# Patient Record
Sex: Male | Born: 1935 | Race: White | Hispanic: No | Smoking: Never smoker
Health system: Southern US, Community
[De-identification: ages and names within clinical notes are randomized; demographics above are authoritative.]

## PROBLEM LIST (undated history)

## (undated) DIAGNOSIS — E785 Hyperlipidemia, unspecified: Secondary | ICD-10-CM

## (undated) DIAGNOSIS — I4892 Unspecified atrial flutter: Secondary | ICD-10-CM

## (undated) DIAGNOSIS — M1990 Unspecified osteoarthritis, unspecified site: Secondary | ICD-10-CM

## (undated) DIAGNOSIS — I2510 Atherosclerotic heart disease of native coronary artery without angina pectoris: Secondary | ICD-10-CM

## (undated) DIAGNOSIS — I499 Cardiac arrhythmia, unspecified: Secondary | ICD-10-CM

## (undated) DIAGNOSIS — R079 Chest pain, unspecified: Secondary | ICD-10-CM

## (undated) DIAGNOSIS — E119 Type 2 diabetes mellitus without complications: Secondary | ICD-10-CM

## (undated) DIAGNOSIS — E039 Hypothyroidism, unspecified: Secondary | ICD-10-CM

## (undated) DIAGNOSIS — I10 Essential (primary) hypertension: Secondary | ICD-10-CM

## (undated) HISTORY — PX: ROTATOR CUFF REPAIR: SHX139

## (undated) HISTORY — PX: REPLACEMENT TOTAL KNEE BILATERAL: SUR1225

## (undated) HISTORY — PX: CARDIAC SURGERY: SHX584

---

## 1998-07-07 ENCOUNTER — Encounter: Payer: Self-pay | Admitting: Orthopedic Surgery

## 1998-07-07 ENCOUNTER — Inpatient Hospital Stay (HOSPITAL_COMMUNITY): Admission: RE | Admit: 1998-07-07 | Discharge: 1998-07-10 | Payer: Self-pay | Admitting: Orthopedic Surgery

## 1998-08-11 ENCOUNTER — Encounter: Admission: RE | Admit: 1998-08-11 | Discharge: 1998-10-23 | Payer: Self-pay | Admitting: Orthopedic Surgery

## 1998-09-22 ENCOUNTER — Ambulatory Visit (HOSPITAL_BASED_OUTPATIENT_CLINIC_OR_DEPARTMENT_OTHER): Admission: RE | Admit: 1998-09-22 | Discharge: 1998-09-22 | Payer: Self-pay | Admitting: Orthopedic Surgery

## 1999-02-02 ENCOUNTER — Ambulatory Visit (HOSPITAL_BASED_OUTPATIENT_CLINIC_OR_DEPARTMENT_OTHER): Admission: RE | Admit: 1999-02-02 | Discharge: 1999-02-02 | Payer: Self-pay | Admitting: Urology

## 2000-12-21 ENCOUNTER — Encounter: Admission: RE | Admit: 2000-12-21 | Discharge: 2001-03-21 | Payer: Self-pay | Admitting: *Deleted

## 2004-03-11 ENCOUNTER — Ambulatory Visit (HOSPITAL_COMMUNITY): Admission: RE | Admit: 2004-03-11 | Discharge: 2004-03-11 | Payer: Self-pay | Admitting: Gastroenterology

## 2004-03-11 ENCOUNTER — Encounter (INDEPENDENT_AMBULATORY_CARE_PROVIDER_SITE_OTHER): Payer: Self-pay | Admitting: *Deleted

## 2006-03-31 ENCOUNTER — Encounter: Admission: RE | Admit: 2006-03-31 | Discharge: 2006-03-31 | Payer: Self-pay | Admitting: Family Medicine

## 2007-12-20 ENCOUNTER — Inpatient Hospital Stay (HOSPITAL_COMMUNITY): Admission: AD | Admit: 2007-12-20 | Discharge: 2007-12-22 | Payer: Self-pay | Admitting: Cardiology

## 2007-12-21 ENCOUNTER — Encounter (INDEPENDENT_AMBULATORY_CARE_PROVIDER_SITE_OTHER): Payer: Self-pay | Admitting: Cardiology

## 2008-08-09 HISTORY — PX: ATRIAL FLUTTER ABLATION: SHX5733

## 2008-09-15 ENCOUNTER — Emergency Department (HOSPITAL_COMMUNITY): Admission: EM | Admit: 2008-09-15 | Discharge: 2008-09-16 | Payer: Self-pay | Admitting: Emergency Medicine

## 2008-11-20 ENCOUNTER — Encounter: Payer: Self-pay | Admitting: Internal Medicine

## 2008-11-20 ENCOUNTER — Ambulatory Visit: Payer: Self-pay | Admitting: Internal Medicine

## 2008-11-20 DIAGNOSIS — I4892 Unspecified atrial flutter: Secondary | ICD-10-CM

## 2008-11-20 DIAGNOSIS — I10 Essential (primary) hypertension: Secondary | ICD-10-CM

## 2008-11-20 HISTORY — DX: Unspecified atrial flutter: I48.92

## 2008-11-20 LAB — CONVERTED CEMR LAB
BUN: 16 mg/dL (ref 6–23)
Basophils Absolute: 0.1 10*3/uL (ref 0.0–0.1)
Basophils Relative: 0.8 % (ref 0.0–3.0)
CO2: 31 meq/L (ref 19–32)
Calcium: 9.4 mg/dL (ref 8.4–10.5)
Chloride: 106 meq/L (ref 96–112)
Creatinine, Ser: 0.9 mg/dL (ref 0.4–1.5)
Eosinophils Absolute: 0.1 10*3/uL (ref 0.0–0.7)
Eosinophils Relative: 1.7 % (ref 0.0–5.0)
GFR calc non Af Amer: 87.39 mL/min (ref >60)
Glucose, Bld: 107 mg/dL — ABNORMAL HIGH (ref 70–99)
HCT: 45.1 % (ref 39.0–52.0)
Hemoglobin: 15.4 g/dL (ref 13.0–17.0)
INR: 1.1 — ABNORMAL HIGH (ref 0.8–1.0)
Lymphocytes Relative: 21 % (ref 12.0–46.0)
Lymphs Abs: 1.7 10*3/uL (ref 0.7–4.0)
MCHC: 34.1 g/dL (ref 30.0–36.0)
MCV: 89.4 fL (ref 78.0–100.0)
Monocytes Absolute: 0.6 10*3/uL (ref 0.1–1.0)
Monocytes Relative: 8 % (ref 3.0–12.0)
Neutro Abs: 5.6 10*3/uL (ref 1.4–7.7)
Neutrophils Relative %: 68.5 % (ref 43.0–77.0)
Platelets: 223 10*3/uL (ref 150.0–400.0)
Potassium: 4.4 meq/L (ref 3.5–5.1)
Prothrombin Time: 11.8 s (ref 10.9–13.3)
RBC: 5.05 M/uL (ref 4.22–5.81)
RDW: 12 % (ref 11.5–14.6)
Sodium: 141 meq/L (ref 135–145)
WBC: 8.1 10*3/uL (ref 4.5–10.5)
aPTT: 29.6 s — ABNORMAL HIGH (ref 21.7–28.8)

## 2008-11-26 ENCOUNTER — Telehealth: Payer: Self-pay | Admitting: Internal Medicine

## 2008-11-27 ENCOUNTER — Inpatient Hospital Stay (HOSPITAL_COMMUNITY): Admission: RE | Admit: 2008-11-27 | Discharge: 2008-11-28 | Payer: Self-pay | Admitting: Internal Medicine

## 2008-11-27 ENCOUNTER — Ambulatory Visit: Payer: Self-pay | Admitting: Internal Medicine

## 2008-12-03 DIAGNOSIS — M1990 Unspecified osteoarthritis, unspecified site: Secondary | ICD-10-CM | POA: Insufficient documentation

## 2008-12-03 DIAGNOSIS — E118 Type 2 diabetes mellitus with unspecified complications: Secondary | ICD-10-CM

## 2008-12-03 DIAGNOSIS — N401 Enlarged prostate with lower urinary tract symptoms: Secondary | ICD-10-CM

## 2008-12-03 DIAGNOSIS — J309 Allergic rhinitis, unspecified: Secondary | ICD-10-CM | POA: Insufficient documentation

## 2008-12-03 DIAGNOSIS — E785 Hyperlipidemia, unspecified: Secondary | ICD-10-CM

## 2009-01-09 ENCOUNTER — Ambulatory Visit: Payer: Self-pay | Admitting: Internal Medicine

## 2010-11-18 LAB — GLUCOSE, CAPILLARY
Glucose-Capillary: 131 mg/dL — ABNORMAL HIGH (ref 70–99)
Glucose-Capillary: 140 mg/dL — ABNORMAL HIGH (ref 70–99)

## 2010-11-24 LAB — CBC
MCV: 87.9 fL (ref 78.0–100.0)
Platelets: 214 10*3/uL (ref 150–400)
RDW: 11.9 % (ref 11.5–15.5)
WBC: 10.8 10*3/uL — ABNORMAL HIGH (ref 4.0–10.5)

## 2010-11-24 LAB — D-DIMER, QUANTITATIVE: D-Dimer, Quant: 0.37 ug/mL-FEU (ref 0.00–0.48)

## 2010-11-24 LAB — POCT CARDIAC MARKERS
CKMB, poc: 3.7 ng/mL (ref 1.0–8.0)
CKMB, poc: 4.7 ng/mL (ref 1.0–8.0)
Myoglobin, poc: 132 ng/mL (ref 12–200)
Troponin i, poc: <0.05 ng/mL (ref 0.00–0.09)

## 2010-11-24 LAB — BASIC METABOLIC PANEL
BUN: 22 mg/dL (ref 6–23)
Chloride: 109 mEq/L (ref 96–112)
Creatinine, Ser: 1.15 mg/dL (ref 0.4–1.5)
Glucose, Bld: 182 mg/dL — ABNORMAL HIGH (ref 70–99)

## 2010-11-24 LAB — DIFFERENTIAL
Basophils Absolute: 0 10*3/uL (ref 0.0–0.1)
Basophils Relative: 0 % (ref 0–1)
Eosinophils Absolute: 0.1 10*3/uL (ref 0.0–0.7)
Eosinophils Relative: 1 % (ref 0–5)
Lymphs Abs: 1.6 10*3/uL (ref 0.7–4.0)
Neutrophils Relative %: 77 % (ref 43–77)

## 2010-11-24 LAB — BRAIN NATRIURETIC PEPTIDE: Pro B Natriuretic peptide (BNP): 75 pg/mL (ref 0.0–100.0)

## 2010-12-22 NOTE — Discharge Summary (Signed)
Cody Rivers, Cody Rivers              ACCOUNT NO.:  [id number]   MEDICAL RECORD NO.:  [id number]          PATIENT TYPE:  INP   LOCATION:  2502                         FACILITY:  [facility]   PHYSICIAN:  Hillis Range, MD       DATE OF BIRTH:  1931-09-10   DATE OF ADMISSION:  11/27/2008  DATE OF DISCHARGE:  11/28/2008                               DISCHARGE SUMMARY   TIME FOR THIS DICTATION:  Greater than 35 minutes.   FINAL DIAGNOSES:  1. Atrial flutter   SECONDARY DIAGNOSES:  1. Diabetes.  2. Hypertension.  3. Benign prostatic hypertrophy.  4. Degenerative joint disease.  5. Dyslipidemia.  6. Echocardiogram done in May 2009, ejection fraction 55-65%, and      trivial mitral regurgitation.  No tricuspid regurgitation.   PROCEDURE:  On November 27, 2008, electrophysiology study with  radiofrequency catheter ablation of a typical atrial flutter   BRIEF HISTORY:  Cody Rivers is a 76 year old male.  He has a history of  atrial flutter and he has been referred to Dr. Johney Frame for consultation  regarding therapeutic strategies for his atrial flutter.   In May 2009, under a period of extreme stress, the patient was diagnosed  with atrial flutter.  His symptoms were palpitations, fatigue,  dizziness, and shortness of breath.  He was cardioverted at that time to  sinus rhythm.  Echocardiogram showed preserved ejection fraction.   He has had 3 subsequent episodes of atrial flutter.  He has not required  cardioversion, but had to emergency room visits, his last to the [facility] ([place] in January 2010, another hospital in [place], several weeks ago.   The patient is unaware of any prior episodes of atrial fibrillation.  His medical therapy is aspirin and metoprolol.  He continues to have  frequency and duration of his atrial flutter, and presents for possible  discussion of atrial flutter ablation strategy.   The risks and benefits of electrophysiology study, radiofrequency, and  catheter ablation  have been discussed with the patient, he wishes to  proceed.   HOSPITAL COURSE:  The patient presents electively on November 27, 2008.  Easily induced was atypical atrial flutter, this degenerated into atrial  fibrillation.  He required successful cardioversion of this, which then  allowed successful cavotricuspid isthmus ablation with bidirectional  block.  There were no inducible dysrhythmias after the catheter  ablation.  The patient has remained bradycardic in the overnight, his  heart rate is 52.  On the day of discharge, oxygen saturation was 97%.  His lungs are clear.  Mentation is also clear.   DISCHARGE MEDICATIONS:  He will be discharged postprocedure day #1, on  the following medications.  He is to stop metoprolol.   To continue:  1. Simvastatin 20 mg daily at bedtime.  2. Enteric-coated aspirin 81 mg daily.  3. Loratadine 10 mg daily.  4. Fish oil 1 capsule daily.  5. Calcium with vitamin D 500/125 one tablet daily.  6. Multivitamin daily.  7. ICAP 1 tablet daily.   FOLLOWUP:  He follows up at [facility] in [address], [place] and see Dr. Johney Frame, January 09, 2009, at 11:15.   LABORATORY STUDIES PERTINENT TO THIS ADMISSION:  Drawn on November 20, 2008; sodium 141, potassium 4.4, chloride 106, carbonate 31, glucose  107, BUN 16, and creatinine 0.9.  Protime 11.8 and INR 1.1.  White cells  8.1, hemoglobin 15.4, hematocrit 45.1, and platelets are 2-3.      Maple Mirza, [place]      Hillis Range, MD  Electronically Signed    GM/MEDQ  D:  11/28/2008  T:  11/28/2008  Job:  [id number]   cc:   Jake Bathe, MD

## 2010-12-22 NOTE — H&P (Signed)
NAMEMOSIAH, Cody Rivers              ACCOUNT NO.:  [id number]   MEDICAL RECORD NO.:  [id number]          PATIENT TYPE:  INP   LOCATION:  3734                         FACILITY:  [facility]   PHYSICIAN:  Jake Bathe, MD      DATE OF BIRTH:  1932-05-18   DATE OF ADMISSION:  12/20/2007  DATE OF DISCHARGE:                              HISTORY & PHYSICAL   CHIEF COMPLAINT:  Atrial flutter.   HISTORY OF PRESENT ILLNESS:  A 77 year old male with no known history of  prior coronary artery disease who over the past 2 weeks has been  complaining of lightheadedness, dizziness, and presyncope.  He went to  his primary care physician, Dr. Clarita Leber who detected a rapid heart rate.  EKG detected atrial flutter rate with 2-1 conduction rate 150 beats per  minute.  He denies any chest pain, palpitations, orthopnea, PND, and  recent weight gain.  He has been under excess of stress in his life with  his son who was diagnosed with pancreatic cancer several months ago.  Denies any bleeding problems, any stroke-like symptoms, any recent  fevers, chills, nausea, vomiting, no recent travel.  He denies any  diarrhea, skin, or hair changes.  No tremors.  No alcohol use or drug  use.   PAST MEDICAL HISTORY:  1. Diet-controlled diabetes on no current medications.  2. Hyperlipidemia - on Zocor.  3. Hypertension - on low dose lisinopril mostly for renal protection.  4. Obesity.   ALLERGIES:  No known drug allergies.   MEDICATIONS:  1. Zocor 20 mg a day.  2. Lisinopril 5 mg a day.  3. Calcium.  4. Vitamin D.  5. Fish oil.  6. Zyrtec 10 mg a day.  7. Multivitamin a day.   SOCIAL HISTORY:  Denies any tobacco, alcohol, or illicit drug use.  Married, son with pancreatic cancer.   FAMILY HISTORY:  No early family history of coronary artery disease or  sudden death.   REVIEW OF SYSTEMS:  Slow urinary strain, otherwise if not explained  above all other 12 review of systems negative.   PHYSICAL EXAMINATION:   VITAL SIGNS:  Blood pressure 114/74, pulse 150,  respirations 18, temperature 98, and satting 97% on room air.  GENERAL:  Alert and oriented x3, lying in bed comfortably in no acute  distress.  Here with his wife.  EYES:  Well-perfused conjunctivae.  EOMI.  No scleral icterus.  NECK:  Supple.  No lymphadenopathy or pulsatile.  Jugular vein noted.  No JVD.  No carotid bruits.  CARDIOVASCULAR:  Tachycardiac, regular rhythm with no appreciable  murmurs, rubs, or gallops.  LUNGS:  Normal PMI.  LUNGS:  Clear to auscultation bilaterally.  Normal respiratory effort.  ABDOMEN:  Soft, nontender.  Normoactive bowel sounds.  No bruits.  Mildly obese.  EXTREMITIES:  No clubbing, cyanosis, or edema.  Normal distal pulses.  SKIN:  Warm, dry, and intact.  No rashes noted.  NEURO:  No tremors.  Nonfocal.  Did not assess gait.   DATA:  EKG from 1 p.m. demonstrates atrial flutter 2-1 conduction with  possible old inferior  infarction with Q waves in III and aVF.  Nonspecific ST-T wave changes.  Prior ECG from Dr. Luis Abed office shows  no change.   CURRENT LABS:  Troponin normal.  CK-MB normal.  Sodium 141, potassium  4.2, BUN 18, creatinine 1.18, and glucose 168.  Liver functions normal.  PT 13.8, INR 1.0.  CBC:  White count 9.6, hemoglobin 14.7, hematocrit  43.6, and platelet count 235.  Chest x-ray has mild pulmonary vascular  congestion, no overt pulmonary edema.  I personally reviewed it.  Prior  medical records reviewed.  Await TSH.   ASSESSMENT/PLAN:  A 77 year old male with new onset atrial flutter,  diabetes, hyperlipidemia, and hypertension.  Atrial flutter - new onset, probably within the past 2 weeks associated  with dizziness.  No excessive shortness of breath.  No chest pain or  palpitations.  We will place on heparin drip, Coumadin.  Use diltiazem  for weight control.   PLAN:  1. Transesophageal echocardiography cardioversion tomorrow, n.p.o.      past midnight.  Risk and benefits  explained to the patient and his      wife including a risk of stroke, heart attack, and death.  Current      lab work reassuring.  Await TSH.  2. Diabetes - glucose 168 elevated.  We will monitor on no meds      currently.  May benefit from metformin start as outpatient.  3. Hyperlipidemia - we will continue Zocor 20 mg at night.  4. Obesity - encourage weight loss.      Jake Bathe, MD  Electronically Signed     MCS/MEDQ  D:  12/20/2007  T:  12/21/2007  Job:  [id number]   cc:   Estill Batten. Womble, NP.

## 2010-12-22 NOTE — Op Note (Signed)
Cody Rivers, Cody Rivers              ACCOUNT NO.:  [id number]   MEDICAL RECORD NO.:  [id number]          PATIENT TYPE:  INP   LOCATION:  2502                         FACILITY:  [facility]   PHYSICIAN:  Hillis Range, MD       DATE OF BIRTH:  09-07-1931   DATE OF PROCEDURE:  11/27/2008  DATE OF DISCHARGE:                               OPERATIVE REPORT   PREPROCEDURE DIAGNOSIS:  Typical-appearing atrial flutter.   POSTPROCEDURE DIAGNOSES:  1. Typical isthmus-dependent right atrial flutter.  2. Inducible atrial fibrillation.   PROCEDURES:  1. Comprehensive electrophysiological study.  2. Coronary sinus pacing and recording.  3. Mapping of supraventricular tachycardia.  4. Ablation of supraventricular tachycardia.  5. Cardioversion.  6. Arrhythmia induction with pacing before and after ablation.   INTRODUCTION:  Cody Rivers is a pleasant 77 year old gentleman with a  history of recently diagnosed atrial flutter.  He reports being in good  health until May 2009 when he developed acute onset of symptomatic  atrial flutter.  He was successfully cardioverted to sinus rhythm at  that time.  He was treated medically with metoprolol but subsequently  had 3 recurrent episodes of atrial flutter with no known precipitants or  triggers.  Review of his EKG reveals typical-appearing atrial flutter  with 2:1 AV conduction.  The patient now presents for EP study and  radiofrequency ablation.   DESCRIPTION OF PROCEDURE:  Informed written consent was obtained and the  patient was brought to the Electrophysiology Lab in a fasting state.  He  was adequately sedated with intravenous Versed and fentanyl as outlined  in the nursing report.  The patient's right groin was prepped and draped  in the usual sterile fashion by the EP lab staff.  Using a percutaneous  Seldinger technique, two 6-French and one 8-French hemostasis sheaths  were placed into his right common femoral vein.  A 6-French decapolar  Polaris  X catheter was introduced through the right common femoral vein  and advanced into the coronary sinus for recording and pacing from this  location.  A 6-French quadripolar Josephson catheter was introduced  through the right common femoral vein and advanced into the right  ventricle for recording and pacing.  This catheter was then pulled back  to the His bundle location.  The patient presented to the  Electrophysiology Lab in sinus bradycardia.  His RR interval measured  1357 milliseconds with a PR interval 168 milliseconds, QRS duration 117  milliseconds, and QT interval 501 milliseconds.  His AH interval  measured 55 milliseconds with a HV interval of 37 milliseconds.  Ventricular pacing was performed which revealed midline concentric  decremental VA conduction with a VA Wenckebach cycle length of 530  milliseconds.  Rapid atrial pacing was performed which revealed no  evidence of PR greater than RR.  The AV Wenckebach cycle length was 410  milliseconds.  Rapid atrial pacing was again performed down to a cycle  length of 250 milliseconds and the patient was observed to have easily  inducible and reproducible atrial flutter with a cycle length of 257  milliseconds when pacing from the  atrium at cycle lengths between 250-  300 milliseconds.  The atrial activation during atrial flutter was  earliest in the proximal coronary sinus and latest in the distal  coronary sinus.  The surface electrogram was consistent with  counterclockwise isthmus-dependent right atrial flutter.  The atrial  flutter was nonsustained.  It could not be electroanatomically mapped or  entrained.  Finally atrial flutter was induced which quickly degenerated  into atrial fibrillation.  The patient was successfully cardioverted to  sinus rhythm with a single synchronized 200-joule biphasic shock with  cardioversion electrodes in the anterior-posterior thoracic  configuration.  As the patient's surface electrogram was  consistent with  counterclockwise isthmus-dependent right atrial flutter and the right  atrium was activated first along the coronary sinus, it was felt prudent  to ablate the cavotricuspid isthmus.  A 7-French [employer] II XP 8-mm ablation catheter was therefore positioned along the  usual cavotricuspid isthmus.  A series of radiofrequency applications  were delivered between the tricuspid valve annulus and the inferior vena  cava along the usual cavotricuspid isthmus.  Additional mapping of  atrial electrograms along the isthmus was performed and bonus  radiofrequency applications were then delivered.  Complete bidirectional  isthmus block was achieved as evident by differential atrial pacing from  the low lateral right atrium.  When pacing from the proximal coronary  sinus, the earliest atrial electrogram recorded across the atrial  isthmus line measured 170 milliseconds.  When pacing from the low  lateral right atrium, the earliest atrial activation recorded from the  coronary sinus proximal catheter was 167 milliseconds.  The patient was  observed without return of conduction through his cavotricuspid isthmus  after 15 minutes.  Atrial extrastimulus testing was performed which  revealed an AH jump with no echo beats and no inducible AV nodal  reentrant tachycardia.  The AV nodal ERP was 500/360 milliseconds.  Rapid atrial pacing was then again performed down to a cycle length of  230 milliseconds with no further inducible atrial flutter.  Following  ablation, the AH interval measured 93 milliseconds with an HV interval  of 40 milliseconds.  After ablation, the AV Wenckebach cycle length was  420 milliseconds.  The procedure was therefore considered completed.  All catheters were removed and the sheaths were aspirated and flushed.  The sheaths were removed and hemostasis was assured.  There were no  early apparent complications.   CONCLUSIONS:  1. Sinus bradycardia  upon presentation.  2. Inducible atrial flutter which appeared to be typical atrial      flutter but was nonsustained and degenerated into atrial      fibrillation.  3. Successful cardioversion of atrial fibrillation to sinus rhythm.  4. Successful cavotricuspid isthmus ablation with complete      bidirectional isthmus block achieved.  5. No inducible atrial arrhythmias following ablation.  6. Dual atrioventricular nodal physiology is present, however, there      are no echo beats or inducible atrioventricular nodal reentrant      tachycardia.  7. No accessory pathways were present.  8. No early apparent complications.      Hillis Range, MD  Electronically Signed     JA/MEDQ  D:  11/27/2008  T:  11/28/2008  Job:  [id number]   cc:   Jake Bathe, MD

## 2010-12-22 NOTE — Op Note (Signed)
NAMEFRANKO, HILLIKER              ACCOUNT NO.:  [id number]   MEDICAL RECORD NO.:  [id number]           PATIENT TYPE:   LOCATION:                                 FACILITY:   PHYSICIAN:  Jake Bathe, MD      DATE OF BIRTH:  Jun 08, 1932   DATE OF PROCEDURE:  12/21/2007  DATE OF DISCHARGE:                               OPERATIVE REPORT   INDICATION:  Atrial flutter.   PROCEDURE IN DETAIL:  Informed consent was obtained.  Risks of stroke,  heart attack, and death were explained to the patient and his wife.  Following transesophageal echocardiogram, which demonstrated no evidence  of left atrial appendage thrombus and trace mitral regurgitation, and  negative bubble study, which was performed secondary to redundant atrial  septum, pads were placed on his anterior and posterior chest wall.  A  total of 75 mcg of fentanyl and 7 mg of Versed were used for conscious  sedation.  A single biphasic shock at 150 joules was administered, which  successfully cardioverted him from atrial flutter to sinus rhythm/sinus  bradycardia, rate ranging from 55 to 65.  He tolerated the procedure  well, hemodynamically stable, currently still sedated in recovery.  Findings were relieved to the patient's wife.   PLAN:  We will transition from IV Cardizem 10 mg an hour to Cardizem CD  240 mg a day.  Continue heparin.  Continue Coumadin.  We will continue  to monitor.  Hopefully, discharge soon.  We will see back in clinic for  follow up in 2 weeks.      Jake Bathe, MD  Electronically Signed     MCS/MEDQ  D:  12/21/2007  T:  12/22/2007  Job:  [id number]

## 2010-12-25 NOTE — Op Note (Signed)
Cody Rivers, Cody Rivers                          ACCOUNT NO.:  [id number]   MEDICAL RECORD NO.:  [id number]                   PATIENT TYPE:  AMB   LOCATION:  ENDO                                 FACILITY:  [facility]   PHYSICIAN:  Graylin Shiver, M.D.                DATE OF BIRTH:  [birth year]   DATE OF PROCEDURE:  03/11/2004  DATE OF DISCHARGE:                                 OPERATIVE REPORT   PROCEDURE:  Colonoscopy with polypectomy.   ENDOSCOPIST:  Graylin Shiver, M.D.   INDICATIONS:  Screening.   INFORMED CONSENT:  Informed consent was obtained after explanation of the  risks of bleeding, infection and perforation.   PREMEDICATION:  Fentanyl 50 mcg IV, Versed 5 mg IV.   DESCRIPTION OF PROCEDURE:  With the patient in the left lateral decubitus  position, a rectal exam was performed and no masses were felt.  The Olympus  colonoscope was inserted into the rectum and advanced around the colon to  the cecum.  The cecal landmarks were identified.  In the cecum, there were  two small polyps.  One was approximately 5 mm and it was sessile.  This was  snared with the mini snare and removed by snare cautery technique.  The  polyp was retrieved and cautery site looked good.  There was a second  smaller polyp about 3 mm in size which was snared with the mini snare and  removed by cold snare technique.  This polyp was suctioned but I am unclear  if it came into the specimen container as it was so small.  The ascending  colon was normal.  The transverse colon was normal.  The descending colon  and sigmoid revealed scattered diverticula.  In the sigmoid colon, there was  a 5 mm sessile polyp snared and removed by snare cautery technique with the  mini snare.  The polyp was retrieved.  The rectum was normal.  He tolerated  the procedure well without complications.   IMPRESSION:  1. Colon polyps (code 211.3).  2. Diverticulosis.   PLAN:  The pathology will be checked.                           Graylin Shiver, M.D.    [name]  D:  03/11/2004  T:  03/11/2004  Job:  [id number]   cc:   Verlan Friends, FNP  [facility]

## 2010-12-25 NOTE — Discharge Summary (Signed)
Cody Rivers, Cody Rivers              ACCOUNT NO.:  [id number]   MEDICAL RECORD NO.:  [id number]          PATIENT TYPE:  INP   LOCATION:  3734                         FACILITY:  [facility]   PHYSICIAN:  Jake Bathe, MD      DATE OF BIRTH:  [birth year]   DATE OF ADMISSION:  12/20/2007  DATE OF DISCHARGE:  12/22/2007                               DISCHARGE SUMMARY   DISCHARGE DIAGNOSES:  1. Atrial flutter status post transesophageal      echocardiography/cardioversion.  2. Anticoagulation with long-term Coumadin therapy, INR 1.4 on      discharge.  3. Dyslipidemia treated.  4. Diabetes mellitus, diet-controlled.  5. Benign prostatic hypertrophy   HOSPITAL COURSE:  Cody Rivers is a 77 year old male with a no known  history of coronary artery disease who complained of one-week history of  lightheadedness and presyncope with no full syncope.  He had no chest  pain, palpitations, PND, or orthopnea.  He saw his primary physician.  On the day of admission, he was found to be in atrial flutter.  He was  sent to the [facility] admission.   Initially, he was started on IV heparin therapy while Coumadin was  started.  He underwent next, a transesophageal echocardiogram that  showed no left atrial thrombus.  Bubble study was negative.  He was then  cardioverted with 150 joules and this promptly placed him back into  normal sinus rhythm.  He was watched overnight and finally discharged  home on Dec 22, 2007, in stable condition.   DISCHARGE LABS:  Hemoglobin 13.0, hematocrit 38.0, platelets 200, white  count 10.0.  Sodium 141, potassium 4.2, BUN 18, creatinine 1.18.  LFTs  normal.  CK-MB and troponin negative.  TSH 1.455.   The patient is discharged to home on following medications:  1. Coumadin 7.5 mg nightly.  2. Cardizem CD 240 mg daily.  3. Lisinopril 500 mg one p.o. daily.  4. Zocor 20 mg nightly.  5. Fish oil daily.  6. Vitamin D daily.  7. Multivitamin daily.  8. Calcium  daily.  9. Lovenox 80 mg subcu twice a day until INR dose is therapeutic.   The patient is to see Dorene Sorrow, our pharmacist on Dec 25, 2007, at 3:15  p.m. to recheck his INR and see if he can stop his Lovenox.  He is to  follow with Dr. Donato Schultz on January 12, 2008, at 1:00 p.m.   DISCHARGE TIME:  Greater than 30 minutes.      Guy Franco, P.A.      Jake Bathe, MD  Electronically Signed    LB/MEDQ  D:  01/19/2008  T:  01/20/2008  Job:  [id number]   cc:   Jake Bathe, MD

## 2011-05-05 LAB — CBC
Hemoglobin: 13
RBC: 4.29
WBC: 10

## 2011-05-05 LAB — PROTIME-INR
INR: 1.4
Prothrombin Time: 17.5 — ABNORMAL HIGH

## 2011-05-05 LAB — HEPARIN LEVEL (UNFRACTIONATED): Heparin Unfractionated: 0.6

## 2012-07-24 ENCOUNTER — Other Ambulatory Visit (HOSPITAL_BASED_OUTPATIENT_CLINIC_OR_DEPARTMENT_OTHER): Payer: Self-pay | Admitting: Family Medicine

## 2012-07-24 DIAGNOSIS — R0602 Shortness of breath: Secondary | ICD-10-CM

## 2012-07-25 ENCOUNTER — Ambulatory Visit (HOSPITAL_BASED_OUTPATIENT_CLINIC_OR_DEPARTMENT_OTHER)
Admission: RE | Admit: 2012-07-25 | Discharge: 2012-07-25 | Disposition: A | Discharge status: Home / Self Care | Payer: Medicare Other | Source: Ambulatory Visit | Attending: Family Medicine | Admitting: Family Medicine

## 2012-07-25 DIAGNOSIS — R05 Cough: Secondary | ICD-10-CM | POA: Insufficient documentation

## 2012-07-25 DIAGNOSIS — R0602 Shortness of breath: Secondary | ICD-10-CM | POA: Insufficient documentation

## 2012-07-25 DIAGNOSIS — R059 Cough, unspecified: Secondary | ICD-10-CM | POA: Insufficient documentation

## 2012-07-25 MED ORDER — IOHEXOL 300 MG/ML  SOLN
80.0000 mL | Freq: Once | INTRAMUSCULAR | Status: AC | PRN
  Administered 2012-07-25: 80 mL via INTRAVENOUS

## 2015-09-20 ENCOUNTER — Encounter (HOSPITAL_COMMUNITY): Payer: Self-pay | Admitting: Emergency Medicine

## 2015-09-20 ENCOUNTER — Emergency Department (HOSPITAL_COMMUNITY)
Admission: EM | Admit: 2015-09-20 | Discharge: 2015-09-20 | Disposition: A | Discharge status: Home / Self Care | Payer: Medicare Other | Attending: Emergency Medicine | Admitting: Emergency Medicine

## 2015-09-20 ENCOUNTER — Emergency Department (HOSPITAL_COMMUNITY): Payer: Medicare Other

## 2015-09-20 DIAGNOSIS — E119 Type 2 diabetes mellitus without complications: Secondary | ICD-10-CM | POA: Diagnosis not present

## 2015-09-20 DIAGNOSIS — M47812 Spondylosis without myelopathy or radiculopathy, cervical region: Secondary | ICD-10-CM

## 2015-09-20 DIAGNOSIS — M47892 Other spondylosis, cervical region: Secondary | ICD-10-CM | POA: Diagnosis not present

## 2015-09-20 DIAGNOSIS — M542 Cervicalgia: Secondary | ICD-10-CM | POA: Diagnosis present

## 2015-09-20 HISTORY — DX: Unspecified osteoarthritis, unspecified site: M19.90

## 2015-09-20 HISTORY — DX: Type 2 diabetes mellitus without complications: E11.9

## 2015-09-20 LAB — I-STAT CHEM 8, ED
BUN: 18 mg/dL (ref 6–20)
CHLORIDE: 101 mmol/L (ref 101–111)
CREATININE: 0.9 mg/dL (ref 0.61–1.24)
Calcium, Ion: 1.26 mmol/L (ref 1.13–1.30)
Glucose, Bld: 306 mg/dL — ABNORMAL HIGH (ref 65–99)
HEMATOCRIT: 45 % (ref 39.0–52.0)
HEMOGLOBIN: 15.3 g/dL (ref 13.0–17.0)
POTASSIUM: 3.8 mmol/L (ref 3.5–5.1)
Sodium: 139 mmol/L (ref 135–145)
TCO2: 23 mmol/L (ref 0–100)

## 2015-09-20 MED ORDER — HYDROCODONE-ACETAMINOPHEN 5-325 MG PO TABS: 1.0000 | ORAL_TABLET | ORAL | Status: DC | PRN

## 2015-09-20 MED ORDER — AMOXICILLIN-POT CLAVULANATE 875-125 MG PO TABS: 1.0000 | ORAL_TABLET | Freq: Two times a day (BID) | ORAL | Status: DC

## 2015-09-20 NOTE — ED Provider Notes (Signed)
CSN: [id number]     Arrival date & time 09/20/15  1191 History   First MD Initiated Contact with Patient 09/20/15 1015     Chief Complaint  Patient presents with  . Neck Pain      HPI Patient awoke about a week ago with neck pain.  Took some ibuprofen thought it was getting better but then got worse again.  Pain is worse when he moves his neck.  He's had no trauma or chiropractic manipulation of the neck.  Has occasional numbness in his hands and arms but he says that's been there for quite some time.  Patient has no dysarthria.  Has past medical history of degenerative arthritis with 2 knee replacements secondary to the arthritis.  Patient denies any fever or chills. Past Medical History  Diagnosis Date  . Diabetes mellitus without complication (HCC)   . Arthritis    Past Surgical History  Procedure Laterality Date  . Replacement total knee bilateral    . Rotator cuff repair      LEFT   No family history on file. Social History  Substance Use Topics  . Smoking status: Never Smoker   . Smokeless tobacco: None  . Alcohol Use: Yes    Review of Systems  Neurological: Negative for dizziness and speech difficulty.  All other systems reviewed and are negative.     Allergies  Robitussin 12 hour cough  Home Medications   Prior to Admission medications   Medication Sig Start Date End Date Taking? Authorizing Provider  HYDROcodone-acetaminophen (NORCO/VICODIN) 5-325 MG tablet Take 1-2 tablets by mouth every 4 (four) hours as needed for severe pain. 09/20/15   Nelva Nay, MD   BP 161/50 mmHg  Pulse 60  Temp(Src) 98.6 F (37 C) (Oral)  Resp 18  Ht  (1.778 m)  Wt 228 lb (103.42 kg)  BMI 32.71 kg/m2 Physical Exam  Constitutional: He is oriented to person, place, and time. He appears well-developed and well-nourished. No distress.  HENT:  Head: Normocephalic and atraumatic.  Eyes: Pupils are equal, round, and reactive to light.  Neck: Carotid bruit is not present.  Decreased range of motion present.    Cardiovascular: Normal rate and intact distal pulses.   Pulmonary/Chest: No respiratory distress.  Abdominal: Normal appearance. He exhibits no distension.  Neurological: He is alert and oriented to person, place, and time. No cranial nerve deficit.  Skin: Skin is warm and dry. No rash noted.  Psychiatric: He has a normal mood and affect. His behavior is normal.  Nursing note and vitals reviewed.   ED Course  Procedures (including critical care time) Labs Review Labs Reviewed  I-STAT CHEM 8, ED - Abnormal; Notable for the following:    Glucose, Bld 306 (*)    All other components within normal limits    Imaging Review Dg Cervical Spine Complete  09/20/2015  CLINICAL DATA:  Neck pain for 1.5 weeks with no known injury EXAM: CERVICAL SPINE - COMPLETE 4+ VIEW COMPARISON:  None. FINDINGS: No evidence of acute fracture, endplate erosion, or prevertebral thickening. Diffuse and advanced disc degeneration with narrowing and spurring. Notably bulky ventral endplate spur from C3-4 deforming the posterior hypopharyngeal wall. Multilevel facet arthropathy, most notable at C4-5 where there is 2 mm of anterolisthesis. Right C3-4 foraminal stenosis from facet spur. IMPRESSION: 1. No acute finding. 2. Advanced disc and facet degeneration as described above. Electronically Signed   By: Marnee Spring M.D.   On: 09/20/2015 10:46   I have  personally reviewed and evaluated these images and lab results as part of my medical decision-making.    MDM   Final diagnoses:  Cervical arthritis (HCC)        Nelva Nay, MD 09/20/15 1126

## 2015-09-20 NOTE — Discharge Instructions (Signed)
Degenerative Disk Disease  Degenerative disk disease is a condition caused by the changes that occur in spinal disks as you grow older. Spinal disks are soft and compressible disks located between the bones of your spine (vertebrae). These disks act like shock absorbers. Degenerative disk disease can affect the whole spine. However, the neck and lower back are most commonly affected. Many changes can occur in the spinal disks with aging, such as:  · The spinal disks may dry and shrink.  · Small tears may occur in the tough, outer covering of the disk (annulus).  · The disk space may become smaller due to loss of water.  · Abnormal growths in the bone (spurs) may occur. This can put pressure on the nerve roots exiting the spinal canal, causing pain.  · The spinal canal may become narrowed.  RISK FACTORS   · Being overweight.  · Having a family history of degenerative disk disease.  · Smoking.  · There is increased risk if you are doing heavy lifting or have a sudden injury.  SIGNS AND SYMPTOMS   Symptoms vary from person to person and may include:  · Pain that varies in intensity. Some people have no pain, while others have severe pain. The location of the pain depends on the part of your backbone that is affected.  ¨ You will have neck or arm pain if a disk in the neck area is affected.  ¨ You will have pain in your back, buttocks, or legs if a disk in the lower back is affected.  · Pain that becomes worse while bending, reaching up, or with twisting movements.  · Pain that may start gradually and then get worse as time passes. It may also start after a major or minor injury.  · Numbness or tingling in the arms or legs.  DIAGNOSIS   Your health care provider will ask you about your symptoms and about activities or habits that may cause the pain. He or she may also ask about any injuries, diseases, or treatments you have had. Your health care provider will examine you to check for the range of movement that is  possible in the affected area, to check for strength in your extremities, and to check for sensation in the areas of the arms and legs supplied by different nerve roots. You may also have:   · An X-ray of the spine.  · Other imaging tests, such as MRI.  TREATMENT   Your health care provider will advise you on the best plan for treatment. Treatment may include:  · Medicines.  · Rehabilitation exercises.  HOME CARE INSTRUCTIONS   · Follow proper lifting and walking techniques as advised by your health care provider.  · Maintain good posture.  · Exercise regularly as advised by your health care provider.  · Perform relaxation exercises.  · Change your sitting, standing, and sleeping habits as advised by your health care provider.  · Change positions frequently.  · Lose weight or maintain a healthy weight as advised by your health care provider.  · Do not use any tobacco products, including cigarettes, chewing tobacco, or electronic cigarettes. If you need help quitting, ask your health care provider.  · Wear supportive footwear.  · Take medicines only as directed by your health care provider.  SEEK MEDICAL CARE IF:   · Your pain does not go away within 1-4 weeks.  · You have significant appetite or weight loss.  SEEK IMMEDIATE MEDICAL CARE IF:   ·   Your pain is severe.  · You notice weakness in your arms, hands, or legs.  · You begin to lose control of your bladder or bowel movements.  · You have fevers or night sweats.  MAKE SURE YOU:   · Understand these instructions.  · Will watch your condition.  · Will get help right away if you are not doing well or get worse.     This information is not intended to replace advice given to you by your health care provider. Make sure you discuss any questions you have with your health care provider.     Document Released: 05/23/2007 Document Revised: 08/16/2014 Document Reviewed: 11/27/2013  Elsevier Interactive Patient Education ©2016 [employer]

## 2015-09-20 NOTE — ED Notes (Signed)
Pt complaint of neck pain for 1.5 weeks; seen at [facility] and given unknown "anti inflammatory drug" and told to come to emergency room for further evaluation. Pt denies injury.

## 2017-07-14 ENCOUNTER — Other Ambulatory Visit: Payer: Self-pay

## 2017-07-14 ENCOUNTER — Inpatient Hospital Stay (HOSPITAL_COMMUNITY)
Admission: EM | Admit: 2017-07-14 | Discharge: 2017-07-23 | DRG: 234 | Disposition: A | Discharge status: Home / Self Care | Payer: Medicare Other | Attending: Surgery | Admitting: Surgery

## 2017-07-14 ENCOUNTER — Encounter (HOSPITAL_COMMUNITY): Payer: Self-pay | Admitting: *Deleted

## 2017-07-14 ENCOUNTER — Emergency Department (HOSPITAL_COMMUNITY): Payer: Medicare Other

## 2017-07-14 DIAGNOSIS — I493 Ventricular premature depolarization: Secondary | ICD-10-CM | POA: Diagnosis not present

## 2017-07-14 DIAGNOSIS — D62 Acute posthemorrhagic anemia: Secondary | ICD-10-CM | POA: Diagnosis not present

## 2017-07-14 DIAGNOSIS — Z833 Family history of diabetes mellitus: Secondary | ICD-10-CM

## 2017-07-14 DIAGNOSIS — I10 Essential (primary) hypertension: Secondary | ICD-10-CM | POA: Diagnosis present

## 2017-07-14 DIAGNOSIS — I2584 Coronary atherosclerosis due to calcified coronary lesion: Secondary | ICD-10-CM | POA: Diagnosis present

## 2017-07-14 DIAGNOSIS — D72829 Elevated white blood cell count, unspecified: Secondary | ICD-10-CM | POA: Diagnosis not present

## 2017-07-14 DIAGNOSIS — Z888 Allergy status to other drugs, medicaments and biological substances status: Secondary | ICD-10-CM

## 2017-07-14 DIAGNOSIS — E8770 Fluid overload, unspecified: Secondary | ICD-10-CM | POA: Diagnosis not present

## 2017-07-14 DIAGNOSIS — I2510 Atherosclerotic heart disease of native coronary artery without angina pectoris: Secondary | ICD-10-CM

## 2017-07-14 DIAGNOSIS — R339 Retention of urine, unspecified: Secondary | ICD-10-CM | POA: Diagnosis not present

## 2017-07-14 DIAGNOSIS — I25110 Atherosclerotic heart disease of native coronary artery with unstable angina pectoris: Principal | ICD-10-CM | POA: Diagnosis present

## 2017-07-14 DIAGNOSIS — R079 Chest pain, unspecified: Secondary | ICD-10-CM

## 2017-07-14 DIAGNOSIS — E118 Type 2 diabetes mellitus with unspecified complications: Secondary | ICD-10-CM

## 2017-07-14 DIAGNOSIS — Z951 Presence of aortocoronary bypass graft: Secondary | ICD-10-CM

## 2017-07-14 DIAGNOSIS — I252 Old myocardial infarction: Secondary | ICD-10-CM

## 2017-07-14 DIAGNOSIS — I9581 Postprocedural hypotension: Secondary | ICD-10-CM | POA: Diagnosis not present

## 2017-07-14 DIAGNOSIS — M1990 Unspecified osteoarthritis, unspecified site: Secondary | ICD-10-CM | POA: Diagnosis present

## 2017-07-14 DIAGNOSIS — R110 Nausea: Secondary | ICD-10-CM | POA: Diagnosis not present

## 2017-07-14 DIAGNOSIS — I4892 Unspecified atrial flutter: Secondary | ICD-10-CM | POA: Diagnosis present

## 2017-07-14 DIAGNOSIS — D696 Thrombocytopenia, unspecified: Secondary | ICD-10-CM | POA: Diagnosis not present

## 2017-07-14 DIAGNOSIS — I4891 Unspecified atrial fibrillation: Secondary | ICD-10-CM | POA: Diagnosis not present

## 2017-07-14 DIAGNOSIS — E785 Hyperlipidemia, unspecified: Secondary | ICD-10-CM

## 2017-07-14 DIAGNOSIS — E782 Mixed hyperlipidemia: Secondary | ICD-10-CM | POA: Diagnosis present

## 2017-07-14 DIAGNOSIS — R32 Unspecified urinary incontinence: Secondary | ICD-10-CM | POA: Diagnosis present

## 2017-07-14 DIAGNOSIS — Z809 Family history of malignant neoplasm, unspecified: Secondary | ICD-10-CM

## 2017-07-14 DIAGNOSIS — E119 Type 2 diabetes mellitus without complications: Secondary | ICD-10-CM | POA: Diagnosis present

## 2017-07-14 DIAGNOSIS — R9439 Abnormal result of other cardiovascular function study: Secondary | ICD-10-CM | POA: Diagnosis not present

## 2017-07-14 DIAGNOSIS — Z96653 Presence of artificial knee joint, bilateral: Secondary | ICD-10-CM | POA: Diagnosis present

## 2017-07-14 HISTORY — DX: Chest pain, unspecified: R07.9

## 2017-07-14 HISTORY — DX: Unspecified atrial flutter: I48.92

## 2017-07-14 HISTORY — DX: Essential (primary) hypertension: I10

## 2017-07-14 HISTORY — DX: Hyperlipidemia, unspecified: E78.5

## 2017-07-14 LAB — BASIC METABOLIC PANEL
ANION GAP: 10 (ref 5–15)
BUN: 20 mg/dL (ref 6–20)
CHLORIDE: 105 mmol/L (ref 101–111)
CO2: 22 mmol/L (ref 22–32)
CREATININE: 1.22 mg/dL (ref 0.61–1.24)
Calcium: 10.1 mg/dL (ref 8.9–10.3)
GFR calc non Af Amer: 53 mL/min — ABNORMAL LOW (ref >60)
GLUCOSE: 165 mg/dL — AB (ref 65–99)
POTASSIUM: 3.9 mmol/L (ref 3.5–5.1)
SODIUM: 137 mmol/L (ref 135–145)

## 2017-07-14 LAB — HEMOGLOBIN A1C
HEMOGLOBIN A1C: 6.7 % — AB (ref 4.8–5.6)
Mean Plasma Glucose: 145.59 mg/dL

## 2017-07-14 LAB — GLUCOSE, CAPILLARY: Glucose-Capillary: 163 mg/dL — ABNORMAL HIGH (ref 65–99)

## 2017-07-14 LAB — TROPONIN I
Troponin I: <0.03 ng/mL (ref <0.03)
Troponin I: <0.03 ng/mL (ref <0.03)

## 2017-07-14 LAB — LIPID PANEL
CHOL/HDL RATIO: 3.5 ratio
Cholesterol: 126 mg/dL (ref 0–200)
HDL: 36 mg/dL — AB (ref >40)
LDL CALC: 52 mg/dL (ref 0–99)
Triglycerides: 188 mg/dL — ABNORMAL HIGH (ref <150)
VLDL: 38 mg/dL (ref 0–40)

## 2017-07-14 LAB — CBC
HCT: 42.7 % (ref 39.0–52.0)
Hemoglobin: 14.9 g/dL (ref 13.0–17.0)
MCH: 30.2 pg (ref 26.0–34.0)
MCHC: 34.9 g/dL (ref 30.0–36.0)
MCV: 86.6 fL (ref 78.0–100.0)
PLATELETS: 204 10*3/uL (ref 150–400)
RBC: 4.93 MIL/uL (ref 4.22–5.81)
RDW: 12.9 % (ref 11.5–15.5)
WBC: 9 10*3/uL (ref 4.0–10.5)

## 2017-07-14 LAB — I-STAT TROPONIN, ED: Troponin i, poc: 0.01 ng/mL (ref 0.00–0.08)

## 2017-07-14 LAB — CBG MONITORING, ED: GLUCOSE-CAPILLARY: 119 mg/dL — AB (ref 65–99)

## 2017-07-14 MED ORDER — VITAMIN B-12 1000 MCG PO TABS
1000.0000 ug | ORAL_TABLET | Freq: Every day | ORAL | Status: DC
  Administered 2017-07-14 – 2017-07-15 (×2): 1000 ug via ORAL
  Filled 2017-07-14 (×2): qty 1

## 2017-07-14 MED ORDER — FUROSEMIDE 20 MG PO TABS
20.0000 mg | ORAL_TABLET | Freq: Every day | ORAL | Status: DC
  Administered 2017-07-14 – 2017-07-15 (×2): 20 mg via ORAL
  Filled 2017-07-14 (×2): qty 1

## 2017-07-14 MED ORDER — SIMVASTATIN 20 MG PO TABS
20.0000 mg | ORAL_TABLET | Freq: Every day | ORAL | Status: DC
  Administered 2017-07-14 – 2017-07-15 (×2): 20 mg via ORAL
  Filled 2017-07-14 (×2): qty 1

## 2017-07-14 MED ORDER — POLYVINYL ALCOHOL 1.4 % OP SOLN
1.0000 [drp] | Freq: Every day | OPHTHALMIC | Status: DC | PRN
  Filled 2017-07-14: qty 15

## 2017-07-14 MED ORDER — ACETAMINOPHEN 325 MG PO TABS: 650.0000 mg | ORAL_TABLET | ORAL | Status: DC | PRN

## 2017-07-14 MED ORDER — HEPARIN SODIUM (PORCINE) 5000 UNIT/ML IJ SOLN
5000.0000 [IU] | Freq: Three times a day (TID) | INTRAMUSCULAR | Status: DC
  Administered 2017-07-14 – 2017-07-15 (×3): 5000 [IU] via SUBCUTANEOUS
  Filled 2017-07-14 (×4): qty 1

## 2017-07-14 MED ORDER — ONDANSETRON HCL 4 MG/2ML IJ SOLN: 4.0000 mg | Freq: Four times a day (QID) | INTRAMUSCULAR | Status: DC | PRN

## 2017-07-14 MED ORDER — TAMSULOSIN HCL 0.4 MG PO CAPS
0.4000 mg | ORAL_CAPSULE | Freq: Every day | ORAL | Status: DC
  Administered 2017-07-14 – 2017-07-15 (×2): 0.4 mg via ORAL
  Filled 2017-07-14 (×2): qty 1

## 2017-07-14 MED ORDER — MORPHINE SULFATE (PF) 4 MG/ML IV SOLN: 2.0000 mg | INTRAVENOUS | Status: DC | PRN

## 2017-07-14 MED ORDER — ALPRAZOLAM 0.25 MG PO TABS: 0.2500 mg | ORAL_TABLET | Freq: Two times a day (BID) | ORAL | Status: DC | PRN

## 2017-07-14 MED ORDER — ADULT MULTIVITAMIN W/MINERALS CH
1.0000 | ORAL_TABLET | Freq: Every day | ORAL | Status: DC
  Administered 2017-07-14 – 2017-07-15 (×2): 1 via ORAL
  Filled 2017-07-14 (×3): qty 1

## 2017-07-14 MED ORDER — LISINOPRIL-HYDROCHLOROTHIAZIDE 20-12.5 MG PO TABS: 1.0000 | ORAL_TABLET | Freq: Every day | ORAL | Status: DC

## 2017-07-14 MED ORDER — VITAMIN A 10000 UNITS PO CAPS
10000.0000 [IU] | ORAL_CAPSULE | Freq: Every day | ORAL | Status: DC
  Filled 2017-07-14: qty 1

## 2017-07-14 MED ORDER — OXYBUTYNIN CHLORIDE 5 MG PO TABS
5.0000 mg | ORAL_TABLET | Freq: Three times a day (TID) | ORAL | Status: DC
  Administered 2017-07-14 – 2017-07-15 (×4): 5 mg via ORAL
  Filled 2017-07-14 (×6): qty 1

## 2017-07-14 MED ORDER — HYDROCHLOROTHIAZIDE 12.5 MG PO CAPS
12.5000 mg | ORAL_CAPSULE | Freq: Every day | ORAL | Status: DC
  Administered 2017-07-14 – 2017-07-15 (×2): 12.5 mg via ORAL
  Filled 2017-07-14 (×2): qty 1

## 2017-07-14 MED ORDER — LISINOPRIL 20 MG PO TABS
20.0000 mg | ORAL_TABLET | Freq: Every day | ORAL | Status: DC
  Administered 2017-07-14 – 2017-07-15 (×2): 20 mg via ORAL
  Filled 2017-07-14 (×2): qty 1

## 2017-07-14 MED ORDER — INSULIN ASPART 100 UNIT/ML ~~LOC~~ SOLN
0.0000 [IU] | Freq: Three times a day (TID) | SUBCUTANEOUS | Status: DC
  Administered 2017-07-15: 1 [IU] via SUBCUTANEOUS
  Administered 2017-07-15: 3 [IU] via SUBCUTANEOUS
  Administered 2017-07-15 – 2017-07-16 (×2): 2 [IU] via SUBCUTANEOUS

## 2017-07-14 NOTE — ED Provider Notes (Signed)
[facility] Provider Note   CSN: [id number] Arrival date & time: 07/14/17  1030     History   Chief Complaint Chief Complaint  Patient presents with  . Chest Pain    HPI Cody Cullensdmund Rivers is a 83 y.o. male.  HPI   Patient is an 83 year old male past medical history significant for hypertension hyperlipidemia and diabetes.  He is presenting today with chest pain.  Patient woke up this morning and was experienced any chest pain substernally.  He reported it was a heaviness and squeezing nature.  Did not radiate anywhere.  He was in distress with it, mild diaphoresis and shortness of breath.  Resolved after taking aspirin.  Patient has history of A. Fib and ablation.    Past Medical History:  Diagnosis Date  . Arthritis   . Diabetes mellitus without complication [facility](HCC)     Patient Active Problem List   Diagnosis Date Noted  . DM 12/03/2008  . HYPERLIPIDEMIA-MIXED 12/03/2008  . ALLERGIC RHINITIS 12/03/2008  . BENIGN PROSTATIC HYPERTROPHY, WITH URINARY OBSTRUCTION 12/03/2008  . DEGENERATIVE JOINT DISEASE 12/03/2008  . HYPERTENSION, BENIGN 11/20/2008  . ATRIAL FLUTTER 11/20/2008    Past Surgical History:  Procedure Laterality Date  . REPLACEMENT TOTAL KNEE BILATERAL    . ROTATOR CUFF REPAIR     LEFT       Home Medications    Prior to Admission medications   Medication Sig Start Date End Date Taking? Authorizing Provider  amoxicillin-clavulanate (AUGMENTIN) 875-125 MG tablet Take 1 tablet by mouth every 12 (twelve) hours. 09/20/15   Nelva NayBeaton, Robert, MD  HYDROcodone-acetaminophen (NORCO/VICODIN) 5-325 MG tablet Take 1-2 tablets by mouth every 4 (four) hours as needed for severe pain. 09/20/15   Nelva NayBeaton, Robert, MD    Family History No family history on file.  Social History Social History   Tobacco Use  . Smoking status: Never Smoker  . Smokeless tobacco: Never Used  Substance Use Topics  . Alcohol use: Yes  . Drug use: No      Allergies   Robitussin 12 hour cough [dextromethorphan polistirex er]   Review of Systems Review of Systems  Constitutional: Negative for activity change, fatigue and fever.  Respiratory: Positive for chest tightness and shortness of breath.   Cardiovascular: Positive for chest pain.  Gastrointestinal: Negative for abdominal pain.     Physical Exam Updated Vital Signs BP (!) 157/74 (BP Location: Right Arm)   Pulse (!) 54   Temp 97.7 F (36.5 C) (Oral)   Resp 16   SpO2 100%   Physical Exam  Constitutional: He is oriented to person, place, and time. He appears well-nourished.  HENT:  Head: Normocephalic.  Eyes: Conjunctivae are normal.  Cardiovascular: Normal rate and normal pulses.  Pulmonary/Chest: Effort normal and breath sounds normal.  Neurological: He is oriented to person, place, and time.  Skin: Skin is warm and dry. He is not diaphoretic.  Psychiatric: He has a normal mood and affect. His behavior is normal.  Nursing note and vitals reviewed.    ED Treatments / Results  Labs (all labs ordered are listed, but only abnormal results are displayed) Labs Reviewed  BASIC METABOLIC PANEL - Abnormal; Notable for the following components:      Result Value   Glucose, Bld 165 (*)    GFR calc non Af Amer 53 (*)    All other components within normal limits  CBC  I-STAT TROPONIN, ED    EKG  EKG Interpretation  Date/Time:  Thursday July 14 2017 10:41:54 EST Ventricular Rate:  54 PR Interval:    QRS Duration: 104 QT Interval:  453 QTC Calculation: 430 R Axis:   51 Text Interpretation:  Sinus rhythm Ventricular bigeminy Borderline repolarization abnormality Sinus bradycardia Confirmed by Bary CastillaMackuen, Courteney ([id number]) on 07/14/2017 12:19:11 PM       Radiology Dg Chest 2 View  Result Date: 07/14/2017 CLINICAL DATA:  Chest pain EXAM: CHEST  2 VIEW COMPARISON:  07/25/2012 CT FINDINGS: Heart and mediastinal contours are within normal limits. No focal  opacities or effusions. No acute bony abnormality. IMPRESSION: No active cardiopulmonary disease. Electronically Signed   By: Charlett NoseKevin  Dover M.D.   On: 07/14/2017 11:38    Procedures Procedures (including critical care time)  Medications Ordered in ED Medications - No data to display   Initial Impression / Assessment and Plan / ED Course  I have reviewed the triage vital signs and the nursing notes.  Pertinent labs & imaging results that were available during my care of the patient were reviewed by me and considered in my medical decision making (see chart for details).     Patient is an 83 year old male past medical history significant for hypertension hyperlipidemia and diabetes.  He is presenting today with chest pain.  Patient woke up this morning and was experienced any chest pain substernally.  He reported it was a heaviness and squeezing nature.  Did not radiate anywhere.  He was in distress with it, mild diaphoresis and shortness of breath.  Resolved after taking aspirin.  Patient has history of A. Fib and ablation.    12:27 PM Patient has no chest pain currently.  Heart score is 4.  Has laredy  taking aspirin.  EKG is nonischemic.  Will admit for serial troponins.  Final Clinical Impressions(s) / ED Diagnoses   Final diagnoses:  None    ED Discharge Orders    None       Mackuen, Cindee Saltourteney Lyn, MD 07/14/17 1227

## 2017-07-14 NOTE — Care Management Note (Signed)
Case Management Note  Patient Details  Name: Wallace Cullensdmund Akhtar MRN: [id number] Date of Birth: 12/27/1931  Subjective/Objective:                  Chest Pain  Action/Plan: CM spoke with the patient and his son at the bedside at 5C07 (ED). Patient lives with his son. Reports no home health services. Patient does not use any DME. He is able to obtain his medications and get to his physician appointments.   Expected Discharge Date:   unknown               Expected Discharge Plan:  Home/Self Care  In-House Referral:     Discharge planning Services  CM Consult  Post Acute Care Choice:    Choice offered to:     DME Arranged:    DME Agency:     HH Arranged:    HH Agency:     Status of Service:  In process, will continue to follow  If discussed at Long Length of Stay Meetings, dates discussed:    Additional Comments:  Antony HasteBennett, Kade Demicco Harris, RN 07/14/2017, 7:32 PM

## 2017-07-14 NOTE — ED Notes (Signed)
Lunch tray given . Son remains at bedside.

## 2017-07-14 NOTE — ED Notes (Signed)
Pt given meal tray.

## 2017-07-14 NOTE — Care Management Obs Status (Signed)
MEDICARE OBSERVATION STATUS NOTIFICATION   Patient Details  Name: Cody Rivers MRN: [id number] Date of Birth: 04/16/1932   Medicare Observation Status Notification Given:  Yes    Antony HasteBennett, Lincon Sahlin Harris, RN 07/14/2017, 6:19 PM

## 2017-07-14 NOTE — H&P (Signed)
History and Physical    Cody Cullensdmund Old ZOX:[id number] DOB: 01/28/1932 DOA: 07/14/2017   PCP: Joycelyn RuaMeyers, Stephen, MD   Patient coming from:  Home    Chief Complaint: Chest pain  HPI: Cody Rivers is a 83 y.o. male with a history of hypertension, diabetes, urine incontinence hyperlipidemia, diabetes, arthritis, prior history of atrial fibrillation status post ablation about 6 years ago presenting today with acute onset of chest pain since about 9 AM, eventually resolved after 40 minutes.  Reported this pain being as heaviness, squeezing, did not radiate, and did not report any diaphoresis, left arm pain.  She does not take aspirin on a daily basis, but he did take 1 dose of 325 mg with significant relief and no recurrence.  He did not take any nitroglycerin, or pain medicines.  He denies any history of MI.  He never had a catheterization.  He denies any recent long distance trip.  No history of PE or DVT. He denies taking any hormonal supplements, or changes in his medications.  He remains very active.  He denies any recent surgeries.  He denies any new stressors.  He denies any family history of MI. He denies any tobacco, alcohol or recreational drug use.  ED Course:  BP 130/67   Pulse (!) 56   Temp 97.7 F (36.5 C) (Oral)   Resp 16   SpO2 98%   EKG sinus rhythm Ventricular bigeminy Borderline repolarization abnormality Troponin and EKG are negative at this time. Patient is currently chest pain-free X-ray without acute findings   Review of Systems:  As per HPI otherwise all other systems reviewed and are negative  Past Medical History:  Diagnosis Date  . Arthritis   . Diabetes mellitus without complication (HCC)   . Hyperlipidemia   . Hypertension     Past Surgical History:  Procedure Laterality Date  . REPLACEMENT TOTAL KNEE BILATERAL    . ROTATOR CUFF REPAIR     LEFT    Social History Social History   Socioeconomic History  . Marital status: Married    Spouse name: Not  on file  . Number of children: Not on file  . Years of education: Not on file  . Highest education level: Not on file  Social Needs  . Financial resource strain: Not on file  . Food insecurity - worry: Not on file  . Food insecurity - inability: Not on file  . Transportation needs - medical: Not on file  . Transportation needs - non-medical: Not on file  Occupational History  . Occupation: retired  Tobacco Use  . Smoking status: Never Smoker  . Smokeless tobacco: Never Used  Substance and Sexual Activity  . Alcohol use: Yes  . Drug use: No  . Sexual activity: No  Other Topics Concern  . Not on file  Social History Narrative  . Not on file     Allergies  Allergen Reactions  . Robitussin 12 Hour Cough [Dextromethorphan Polistirex Er] Nausea And Vomiting    History reviewed. No pertinent family history.    Prior to Admission medications   Medication Sig Start Date End Date Taking? Authorizing Provider  amoxicillin-clavulanate (AUGMENTIN) 875-125 MG tablet Take 1 tablet by mouth every 12 (twelve) hours. 09/20/15   Nelva NayBeaton, Robert, MD  HYDROcodone-acetaminophen (NORCO/VICODIN) 5-325 MG tablet Take 1-2 tablets by mouth every 4 (four) hours as needed for severe pain. 09/20/15   Nelva NayBeaton, Robert, MD    Physical Exam:  Vitals:   07/14/17 1057 07/14/17 1100 07/14/17  1200 07/14/17 1300  BP: (!) 157/74 (!) 142/72 139/74 130/67  Pulse: (!) 54 (!) 29 (!) 55 (!) 56  Resp: 16 16 17 16   Temp: 97.7 F (36.5 C)     TempSrc: Oral     SpO2: 100% (!) 87% 99% 98%   Constitutional: NAD, calm, comfortable.  Looks younger than his stated age. Eyes: PERRL, lids and conjunctivae normal ENMT: Mucous membranes are moist, without exudate or lesions  Neck: normal, supple, no masses, no thyromegaly Respiratory: clear to auscultation bilaterally, no wheezing, no crackles. Normal respiratory effort  Cardiovascular: Regular rate and rhythm, very soft 1 out of 6 murmur, rubs or gallops. No extremity  edema. 2+ pedal pulses. No carotid bruits.  Abdomen: Soft, non tender, No hepatosplenomegaly. Bowel sounds positive.  Musculoskeletal: no clubbing / cyanosis. Moves all extremities Skin: no jaundice, No lesions.  Neurologic: Sensation intact  Strength equal in all extremities Psychiatric:   Alert and oriented x 3. Normal mood.     Labs on Admission: I have personally reviewed following labs and imaging studies  CBC: Recent Labs  Lab 07/14/17 1102  WBC 9.0  HGB 14.9  HCT 42.7  MCV 86.6  PLT 204    Basic Metabolic Panel: Recent Labs  Lab 07/14/17 1102  NA 137  K 3.9  CL 105  CO2 22  GLUCOSE 165*  BUN 20  CREATININE 1.22  CALCIUM 10.1    GFR: CrCl cannot be calculated (Unknown ideal weight.).  Liver Function Tests: No results for input(s): AST, ALT, ALKPHOS, BILITOT, PROT, ALBUMIN in the last 168 hours. No results for input(s): LIPASE, AMYLASE in the last 168 hours. No results for input(s): AMMONIA in the last 168 hours.  Coagulation Profile: No results for input(s): INR, PROTIME in the last 168 hours.  Cardiac Enzymes: No results for input(s): CKTOTAL, CKMB, CKMBINDEX, TROPONINI in the last 168 hours.  BNP (last 3 results) No results for input(s): PROBNP in the last 8760 hours.  HbA1C: No results for input(s): HGBA1C in the last 72 hours.  CBG: No results for input(s): GLUCAP in the last 168 hours.  Lipid Profile: No results for input(s): CHOL, HDL, LDLCALC, TRIG, CHOLHDL, LDLDIRECT in the last 72 hours.  Thyroid Function Tests: No results for input(s): TSH, T4TOTAL, FREET4, T3FREE, THYROIDAB in the last 72 hours.  Anemia Panel: No results for input(s): VITAMINB12, FOLATE, FERRITIN, TIBC, IRON, RETICCTPCT in the last 72 hours.  Urine analysis: No results found for: COLORURINE, APPEARANCEUR, LABSPEC, PHURINE, GLUCOSEU, HGBUR, BILIRUBINUR, KETONESUR, PROTEINUR, UROBILINOGEN, NITRITE, LEUKOCYTESUR  Sepsis  Labs: @LABRCNTIP (procalcitonin:4,lacticidven:4) )No results found for this or any previous visit (from the past 240 hour(s)).   Radiological Exams on Admission: Dg Chest 2 View  Result Date: 07/14/2017 CLINICAL DATA:  Chest pain EXAM: CHEST  2 VIEW COMPARISON:  07/25/2012 CT FINDINGS: Heart and mediastinal contours are within normal limits. No focal opacities or effusions. No acute bony abnormality. IMPRESSION: No active cardiopulmonary disease. Electronically Signed   By: Charlett NoseKevin  Dover M.D.   On: 07/14/2017 11:38    EKG: Independently reviewed.  Assessment/Plan Active Problems:   Diabetes (HCC)   Hyperlipidemia   Atrial flutter (HCC)   Hypertension   Chest pain    Chest pain syndrome, cardiac versus musculoskeletal HEART score 5 . Troponin neg  , EKG without evidence of ACS. CP relieved by aspirin. CXR unrevealing.  Risk factors include HTN, HLD, DM, preior history of A flutter sp ablation   Admit to Telemetry/ Observation Chest pain order set  Cycle troponins EKG in am continue ASA, O2 and NTG as needed GI cocktail Check Lipid panel  Hb A1C We will request stress test by cardiology Eugenie Birks)  Type II Diabetes Current blood sugar level is 165 No results found for: HGBA1C Hgb A1C SSI   Hypertension BP  157/74  Pulse  54   Controlled Continue home anti-hypertensive medications    Hyperlipidemia Continue home statins       DVT prophylaxis:  Heparin  Code Status:    Full  Family Communication:  Discussed with patient Disposition Plan: Expect patient to be discharged to home after condition improves Consults called:    None  Admission status: Tele Obs    Marlowe Kays, PA-C [facility]   07/14/2017, 2:18 PM

## 2017-07-14 NOTE — ED Triage Notes (Signed)
States he woke up this am c/o pressure in the center of his chest self administered ASA 324 mg and approx. 10 mins later pain resolved. Denies chest pain or sob denies n/v.

## 2017-07-15 ENCOUNTER — Other Ambulatory Visit: Payer: Self-pay

## 2017-07-15 ENCOUNTER — Encounter (HOSPITAL_COMMUNITY): Payer: Self-pay | Admitting: Physician Assistant

## 2017-07-15 ENCOUNTER — Encounter (HOSPITAL_COMMUNITY)
Admission: EM | Disposition: A | Discharge status: Home / Self Care | Payer: Self-pay | Source: Home / Self Care | Attending: Surgery

## 2017-07-15 ENCOUNTER — Observation Stay (HOSPITAL_COMMUNITY): Payer: Medicare Other

## 2017-07-15 DIAGNOSIS — D696 Thrombocytopenia, unspecified: Secondary | ICD-10-CM | POA: Diagnosis not present

## 2017-07-15 DIAGNOSIS — E119 Type 2 diabetes mellitus without complications: Secondary | ICD-10-CM | POA: Diagnosis present

## 2017-07-15 DIAGNOSIS — R339 Retention of urine, unspecified: Secondary | ICD-10-CM | POA: Diagnosis not present

## 2017-07-15 DIAGNOSIS — I959 Hypotension, unspecified: Secondary | ICD-10-CM

## 2017-07-15 DIAGNOSIS — R9439 Abnormal result of other cardiovascular function study: Secondary | ICD-10-CM | POA: Diagnosis not present

## 2017-07-15 DIAGNOSIS — D72829 Elevated white blood cell count, unspecified: Secondary | ICD-10-CM | POA: Diagnosis not present

## 2017-07-15 DIAGNOSIS — I9581 Postprocedural hypotension: Secondary | ICD-10-CM | POA: Diagnosis not present

## 2017-07-15 DIAGNOSIS — I10 Essential (primary) hypertension: Secondary | ICD-10-CM | POA: Diagnosis present

## 2017-07-15 DIAGNOSIS — Z96653 Presence of artificial knee joint, bilateral: Secondary | ICD-10-CM | POA: Diagnosis present

## 2017-07-15 DIAGNOSIS — E8770 Fluid overload, unspecified: Secondary | ICD-10-CM | POA: Diagnosis not present

## 2017-07-15 DIAGNOSIS — I25110 Atherosclerotic heart disease of native coronary artery with unstable angina pectoris: Principal | ICD-10-CM

## 2017-07-15 DIAGNOSIS — I4892 Unspecified atrial flutter: Secondary | ICD-10-CM | POA: Diagnosis present

## 2017-07-15 DIAGNOSIS — Z888 Allergy status to other drugs, medicaments and biological substances status: Secondary | ICD-10-CM | POA: Diagnosis not present

## 2017-07-15 DIAGNOSIS — D62 Acute posthemorrhagic anemia: Secondary | ICD-10-CM | POA: Diagnosis not present

## 2017-07-15 DIAGNOSIS — Z833 Family history of diabetes mellitus: Secondary | ICD-10-CM | POA: Diagnosis not present

## 2017-07-15 DIAGNOSIS — I2510 Atherosclerotic heart disease of native coronary artery without angina pectoris: Secondary | ICD-10-CM

## 2017-07-15 DIAGNOSIS — R32 Unspecified urinary incontinence: Secondary | ICD-10-CM | POA: Diagnosis present

## 2017-07-15 DIAGNOSIS — R110 Nausea: Secondary | ICD-10-CM | POA: Diagnosis not present

## 2017-07-15 DIAGNOSIS — I4891 Unspecified atrial fibrillation: Secondary | ICD-10-CM | POA: Diagnosis not present

## 2017-07-15 DIAGNOSIS — Z809 Family history of malignant neoplasm, unspecified: Secondary | ICD-10-CM | POA: Diagnosis not present

## 2017-07-15 DIAGNOSIS — R079 Chest pain, unspecified: Secondary | ICD-10-CM | POA: Diagnosis present

## 2017-07-15 DIAGNOSIS — E782 Mixed hyperlipidemia: Secondary | ICD-10-CM | POA: Diagnosis present

## 2017-07-15 DIAGNOSIS — I493 Ventricular premature depolarization: Secondary | ICD-10-CM | POA: Diagnosis not present

## 2017-07-15 DIAGNOSIS — M1990 Unspecified osteoarthritis, unspecified site: Secondary | ICD-10-CM | POA: Diagnosis present

## 2017-07-15 DIAGNOSIS — I2584 Coronary atherosclerosis due to calcified coronary lesion: Secondary | ICD-10-CM | POA: Diagnosis present

## 2017-07-15 HISTORY — PX: LEFT HEART CATH AND CORONARY ANGIOGRAPHY: CATH118249

## 2017-07-15 LAB — BASIC METABOLIC PANEL
Anion gap: 12 (ref 5–15)
BUN: 23 mg/dL — ABNORMAL HIGH (ref 6–20)
CO2: 20 mmol/L — ABNORMAL LOW (ref 22–32)
Calcium: 9.5 mg/dL (ref 8.9–10.3)
Chloride: 105 mmol/L (ref 101–111)
Creatinine, Ser: 1.31 mg/dL — ABNORMAL HIGH (ref 0.61–1.24)
GFR calc Af Amer: 56 mL/min — ABNORMAL LOW (ref >60)
GFR calc non Af Amer: 49 mL/min — ABNORMAL LOW (ref >60)
Glucose, Bld: 185 mg/dL — ABNORMAL HIGH (ref 65–99)
Potassium: 3.4 mmol/L — ABNORMAL LOW (ref 3.5–5.1)
Sodium: 137 mmol/L (ref 135–145)

## 2017-07-15 LAB — PROTIME-INR
INR: 1
Prothrombin Time: 13.1 seconds (ref 11.4–15.2)

## 2017-07-15 LAB — GLUCOSE, CAPILLARY
GLUCOSE-CAPILLARY: 219 mg/dL — AB (ref 65–99)
GLUCOSE-CAPILLARY: 143 mg/dL — AB (ref 65–99)
GLUCOSE-CAPILLARY: 154 mg/dL — AB (ref 65–99)
Glucose-Capillary: 154 mg/dL — ABNORMAL HIGH (ref 65–99)

## 2017-07-15 LAB — NM MYOCAR MULTI W/SPECT W/WALL MOTION / EF
CHL CUP MPHR: 137 {beats}/min
CHL CUP RESTING HR STRESS: 52 {beats}/min
CSEPED: 0 min
CSEPHR: 64 %
Estimated workload: 1 METS
Exercise duration (sec): 0 s
Peak HR: 88 {beats}/min

## 2017-07-15 LAB — TROPONIN I: Troponin I: <0.03 ng/mL (ref <0.03)

## 2017-07-15 LAB — MRSA PCR SCREENING: MRSA by PCR: NEGATIVE

## 2017-07-15 SURGERY — LEFT HEART CATH AND CORONARY ANGIOGRAPHY
Anesthesia: LOCAL

## 2017-07-15 MED ORDER — SODIUM CHLORIDE 0.9% FLUSH: 3.0000 mL | INTRAVENOUS | Status: DC | PRN

## 2017-07-15 MED ORDER — SODIUM CHLORIDE 0.9 % IV SOLN: 250.0000 mL | INTRAVENOUS | Status: DC | PRN

## 2017-07-15 MED ORDER — POTASSIUM CHLORIDE CRYS ER 20 MEQ PO TBCR
20.0000 meq | EXTENDED_RELEASE_TABLET | Freq: Once | ORAL | Status: AC
  Administered 2017-07-15: 20 meq via ORAL
  Filled 2017-07-15: qty 1

## 2017-07-15 MED ORDER — SODIUM CHLORIDE 0.9% FLUSH: 3.0000 mL | Freq: Two times a day (BID) | INTRAVENOUS | Status: DC

## 2017-07-15 MED ORDER — MORPHINE SULFATE (PF) 4 MG/ML IV SOLN
2.0000 mg | INTRAVENOUS | Status: DC | PRN
  Administered 2017-07-15: 2 mg via INTRAVENOUS
  Filled 2017-07-15: qty 1

## 2017-07-15 MED ORDER — ASPIRIN 81 MG PO CHEW
81.0000 mg | CHEWABLE_TABLET | ORAL | Status: DC
  Filled 2017-07-15: qty 1

## 2017-07-15 MED ORDER — REGADENOSON 0.4 MG/5ML IV SOLN
INTRAVENOUS | Status: AC
  Administered 2017-07-15: 0.4 mg via INTRAVENOUS
  Filled 2017-07-15: qty 5

## 2017-07-15 MED ORDER — DOPAMINE-DEXTROSE 3.2-5 MG/ML-% IV SOLN
INTRAVENOUS | Status: DC | PRN
  Administered 2017-07-15: 10 ug/kg/min via INTRAVENOUS

## 2017-07-15 MED ORDER — HEPARIN (PORCINE) IN NACL 2-0.9 UNIT/ML-% IJ SOLN
INTRAMUSCULAR | Status: AC
  Filled 2017-07-15: qty 1000

## 2017-07-15 MED ORDER — OXYCODONE HCL 5 MG PO TABS
5.0000 mg | ORAL_TABLET | ORAL | Status: DC | PRN
  Administered 2017-07-15: 5 mg via ORAL
  Filled 2017-07-15: qty 1

## 2017-07-15 MED ORDER — HEPARIN SODIUM (PORCINE) 1000 UNIT/ML IJ SOLN
INTRAMUSCULAR | Status: AC
  Filled 2017-07-15: qty 1

## 2017-07-15 MED ORDER — ONDANSETRON HCL 4 MG/2ML IJ SOLN
INTRAMUSCULAR | Status: DC | PRN
  Administered 2017-07-15: 4 mg via INTRAVENOUS

## 2017-07-15 MED ORDER — TECHNETIUM TC 99M TETROFOSMIN IV KIT
30.0000 | PACK | Freq: Once | INTRAVENOUS | Status: AC | PRN
  Administered 2017-07-15: 30 via INTRAVENOUS

## 2017-07-15 MED ORDER — SODIUM CHLORIDE 0.9 % WEIGHT BASED INFUSION: 1.0000 mL/kg/h | INTRAVENOUS | Status: DC

## 2017-07-15 MED ORDER — LIDOCAINE HCL (PF) 1 % IJ SOLN
INTRAMUSCULAR | Status: AC
  Filled 2017-07-15: qty 30

## 2017-07-15 MED ORDER — LIDOCAINE HCL (PF) 1 % IJ SOLN
INTRAMUSCULAR | Status: DC | PRN
  Administered 2017-07-15: 5 mL
  Administered 2017-07-15: 10 mL

## 2017-07-15 MED ORDER — MIDAZOLAM HCL 2 MG/2ML IJ SOLN
INTRAMUSCULAR | Status: DC | PRN
  Administered 2017-07-15: 2 mg via INTRAVENOUS

## 2017-07-15 MED ORDER — VERAPAMIL HCL 2.5 MG/ML IV SOLN
INTRAVENOUS | Status: AC
  Filled 2017-07-15: qty 2

## 2017-07-15 MED ORDER — SODIUM CHLORIDE 0.9 % IV SOLN
INTRAVENOUS | Status: AC | PRN
  Administered 2017-07-15: 200 mL
  Administered 2017-07-15: 10 mL/h via INTRAVENOUS

## 2017-07-15 MED ORDER — FENTANYL CITRATE (PF) 100 MCG/2ML IJ SOLN
INTRAMUSCULAR | Status: DC | PRN
  Administered 2017-07-15: 25 ug via INTRAVENOUS

## 2017-07-15 MED ORDER — VERAPAMIL HCL 2.5 MG/ML IV SOLN
INTRAVENOUS | Status: DC | PRN
  Administered 2017-07-15: 10 mL via INTRA_ARTERIAL

## 2017-07-15 MED ORDER — MIDAZOLAM HCL 2 MG/2ML IJ SOLN
INTRAMUSCULAR | Status: AC
  Filled 2017-07-15: qty 2

## 2017-07-15 MED ORDER — REGADENOSON 0.4 MG/5ML IV SOLN
0.4000 mg | Freq: Once | INTRAVENOUS | Status: AC
  Administered 2017-07-15: 0.4 mg via INTRAVENOUS
  Filled 2017-07-15: qty 5

## 2017-07-15 MED ORDER — IOPAMIDOL (ISOVUE-370) INJECTION 76%
INTRAVENOUS | Status: AC
  Filled 2017-07-15: qty 100

## 2017-07-15 MED ORDER — POTASSIUM CHLORIDE CRYS ER 20 MEQ PO TBCR
40.0000 meq | EXTENDED_RELEASE_TABLET | Freq: Once | ORAL | Status: AC
Start: 2017-07-15 — End: 2017-07-15
  Administered 2017-07-15: 40 meq via ORAL
  Filled 2017-07-15: qty 2

## 2017-07-15 MED ORDER — TECHNETIUM TC 99M TETROFOSMIN IV KIT
10.0000 | PACK | Freq: Once | INTRAVENOUS | Status: AC | PRN
  Administered 2017-07-15: 10 via INTRAVENOUS

## 2017-07-15 MED ORDER — HEPARIN (PORCINE) IN NACL 2-0.9 UNIT/ML-% IJ SOLN
INTRAMUSCULAR | Status: AC
  Filled 2017-07-15: qty 500

## 2017-07-15 MED ORDER — HEPARIN SODIUM (PORCINE) 1000 UNIT/ML IJ SOLN
INTRAMUSCULAR | Status: DC | PRN
  Administered 2017-07-15: 5000 [IU] via INTRAVENOUS
  Administered 2017-07-15: 4000 [IU] via INTRAVENOUS

## 2017-07-15 MED ORDER — ONDANSETRON HCL 4 MG/2ML IJ SOLN
INTRAMUSCULAR | Status: AC
  Filled 2017-07-15: qty 2

## 2017-07-15 MED ORDER — HEPARIN (PORCINE) IN NACL 100-0.45 UNIT/ML-% IJ SOLN
950.0000 [IU]/h | INTRAMUSCULAR | Status: DC
  Administered 2017-07-15: 950 [IU]/h via INTRAVENOUS
  Filled 2017-07-15: qty 250

## 2017-07-15 MED ORDER — HEPARIN (PORCINE) IN NACL 2-0.9 UNIT/ML-% IJ SOLN
INTRAMUSCULAR | Status: AC | PRN
  Administered 2017-07-15: 1000 mL
  Administered 2017-07-15: 500 mL

## 2017-07-15 MED ORDER — SODIUM CHLORIDE 0.9 % WEIGHT BASED INFUSION: 3.0000 mL/kg/h | INTRAVENOUS | Status: DC

## 2017-07-15 MED ORDER — IOPAMIDOL (ISOVUE-370) INJECTION 76%
INTRAVENOUS | Status: DC | PRN
  Administered 2017-07-15: 85 mL via INTRA_ARTERIAL

## 2017-07-15 MED ORDER — DOPAMINE-DEXTROSE 3.2-5 MG/ML-% IV SOLN
INTRAVENOUS | Status: AC
  Filled 2017-07-15: qty 250

## 2017-07-15 MED ORDER — FENTANYL CITRATE (PF) 100 MCG/2ML IJ SOLN
INTRAMUSCULAR | Status: AC
  Filled 2017-07-15: qty 2

## 2017-07-15 SURGICAL SUPPLY — 18 items
BALLN IABP SENSA PLUS 8F 50CC (BALLOONS) ×3
BALLOON IABP SENS PLUS 8F 50CC (BALLOONS) IMPLANT
CATH 5FR JL3.5 JR4 ANG PIG MP (CATHETERS) ×1 IMPLANT
COVER PRB 48X5XTLSCP FOLD TPE (BAG) IMPLANT
COVER PROBE 5X48 (BAG) ×3
DEVICE RAD COMP TR BAND LRG (VASCULAR PRODUCTS) ×1 IMPLANT
ELECT DEFIB PAD ADLT CADENCE (PAD) ×1 IMPLANT
GLIDESHEATH SLEND SS 6F .021 (SHEATH) ×1 IMPLANT
GUIDEWIRE INQWIRE 1.5J.035X260 (WIRE) IMPLANT
INQWIRE 1.5J .035X260CM (WIRE) ×3
KIT HEART LEFT (KITS) ×3 IMPLANT
KIT MICROINTRODUCER STIFF 5F (SHEATH) ×1 IMPLANT
PACK CARDIAC CATHETERIZATION (CUSTOM PROCEDURE TRAY) ×3 IMPLANT
SHEATH PINNACLE 6F 10CM (SHEATH) ×1 IMPLANT
SYR MEDRAD MARK V 150ML (SYRINGE) ×3 IMPLANT
TRANSDUCER W/STOPCOCK (MISCELLANEOUS) ×3 IMPLANT
TUBING CIL FLEX 10 FLL-RA (TUBING) ×3 IMPLANT
WIRE EMERALD 3MM-J .035X150CM (WIRE) ×1 IMPLANT

## 2017-07-15 NOTE — Progress Notes (Signed)
Dr. Sunnie [facility]egalado [facility] Dr. Sunnie Nielsenegalado has notified cardiology to consult pt.

## 2017-07-15 NOTE — Progress Notes (Signed)
PROGRESS NOTE    Cody Rivers  ZOX:[id number] DOB: 04/12/1932 DOA: 07/14/2017 PCP: Joycelyn RuaMeyers, Stephen, MD    Brief Narrative: Cody Cullensdmund Betts is a 83 y.o. male with a history of hypertension, diabetes, urine incontinence hyperlipidemia, diabetes, arthritis, prior history of atrial fibrillation status post ablation about 6 years ago presenting today with acute onset of chest pain since about 9 AM, eventually resolved after 40 minutes.  Reported this pain being as heaviness, squeezing, did not radiate, and did not report any diaphoresis, left arm pain.  She does not take aspirin on a daily basis, but he did take 1 dose of 325 mg with significant relief and no recurrence.  He did not take any nitroglycerin, or pain medicines.  He denies any history of MI.  He never had a catheterization.  He denies any recent long distance trip.  No history of PE or DVT. He denies taking any hormonal supplements, or changes in his medications.  He remains very active.  He denies any recent surgeries.  He denies any new stressors.  He denies any family history of MI. He denies any tobacco, alcohol or recreational drug use.  ED Course:  BP 130/67   Pulse (!) 56   Temp 97.7 F (36.5 C) (Oral)   Resp 16   SpO2 98%   EKG sinus rhythm Ventricular bigeminy Borderline repolarization abnormality Troponin and EKG are negative at this time. Patient is currently chest pain-free X-ray without acute findings     Assessment & Plan:   Active Problems:   Diabetes (HCC)   Hyperlipidemia   Atrial flutter (HCC)   Hypertension   Chest pain   1-Chest pain;  Troponin negative.  Stress test with reversible ischemia. Cardiology consulted. Await recommendations.   DM type II;  SSI.  Hold oral medications.   HTN; continue with lisinopril , lasix.   Hyperlipidemia Continue home statins     DVT prophylaxis:  Code Status: full code.  Family Communication; care discussed with patient  Disposition Plan: await  cardiology evaluation.   Consultants:   Cardiology    Procedures:   Stress test; intermediate risk.    Antimicrobials:   none   Subjective: He denies chest pain.  He wake up yesterday with chest pain, resolved after 20 minutes , after aspirin.  He denies chest pain on exertion./   Objective: Vitals:   07/15/17 0900 07/15/17 0928 07/15/17 0930 07/15/17 0932  BP: 136/70 (!) 142/89 134/73 (!) 153/74  Pulse:      Resp:      Temp:      TempSrc:      SpO2:      Weight:      Height:        Intake/Output Summary (Last 24 hours) at 07/15/2017 1435 Last data filed at 07/15/2017 0900 Gross per 24 hour  Intake 0 ml  Output 450 ml  Net -450 ml   Filed Weights   07/14/17 2227 07/15/17 0624  Weight: 97.4 kg (214 lb 11.2 oz) 96.8 kg (213 lb 8 oz)    Examination:  General exam: Appears calm and comfortable  Respiratory system: Clear to auscultation. Respiratory effort normal. Cardiovascular system: S1 & S2 heard, RRR. No JVD, murmurs, rubs, gallops or clicks. No pedal edema. Gastrointestinal system: Abdomen is nondistended, soft and nontender. No organomegaly or masses felt. Normal bowel sounds heard. Central nervous system: Alert and oriented. No focal neurological deficits. Extremities: Symmetric 5 x 5 power. Skin: No rashes, lesions or ulcers Psychiatry: Judgement and  insight appear normal. Mood & affect appropriate.     Data Reviewed: I have personally reviewed following labs and imaging studies  CBC: Recent Labs  Lab 07/14/17 1102  WBC 9.0  HGB 14.9  HCT 42.7  MCV 86.6  PLT 204   Basic Metabolic Panel: Recent Labs  Lab 07/14/17 1102  NA 137  K 3.9  CL 105  CO2 22  GLUCOSE 165*  BUN 20  CREATININE 1.22  CALCIUM 10.1   GFR: Estimated Creatinine Clearance: 53.5 mL/min (by C-G formula based on SCr of 1.22 mg/dL). Liver Function Tests: No results for input(s): AST, ALT, ALKPHOS, BILITOT, PROT, ALBUMIN in the last 168 hours. No results for  input(s): LIPASE, AMYLASE in the last 168 hours. No results for input(s): AMMONIA in the last 168 hours. Coagulation Profile: No results for input(s): INR, PROTIME in the last 168 hours. Cardiac Enzymes: Recent Labs  Lab 07/14/17 1725 07/14/17 2140 07/15/17 0028  TROPONINI <0.03 <0.03 <0.03   BNP (last 3 results) No results for input(s): PROBNP in the last 8760 hours. HbA1C: Recent Labs    07/14/17 1725  HGBA1C 6.7*   CBG: Recent Labs  Lab 07/14/17 2006 07/14/17 2226 07/15/17 0722 07/15/17 1047 07/15/17 1251  GLUCAP 119* 163* 154* 219* 143*   Lipid Profile: Recent Labs    07/14/17 1725  CHOL 126  HDL 36*  LDLCALC 52  TRIG 161*  CHOLHDL 3.5   Thyroid Function Tests: No results for input(s): TSH, T4TOTAL, FREET4, T3FREE, THYROIDAB in the last 72 hours. Anemia Panel: No results for input(s): VITAMINB12, FOLATE, FERRITIN, TIBC, IRON, RETICCTPCT in the last 72 hours. Sepsis Labs: No results for input(s): PROCALCITON, LATICACIDVEN in the last 168 hours.  No results found for this or any previous visit (from the past 240 hour(s)).       Radiology Studies: Dg Chest 2 View  Result Date: 07/14/2017 CLINICAL DATA:  Chest pain EXAM: CHEST  2 VIEW COMPARISON:  07/25/2012 CT FINDINGS: Heart and mediastinal contours are within normal limits. No focal opacities or effusions. No acute bony abnormality. IMPRESSION: No active cardiopulmonary disease. Electronically Signed   By: Charlett Nose M.D.   On: 07/14/2017 11:38   Nm Myocar Multi W/spect W/wall Motion / Ef  Result Date: 07/15/2017 CLINICAL DATA:  Chest pain. Diabetes. Hypertension. Elevated lipids. EXAM: MYOCARDIAL IMAGING WITH SPECT (REST AND PHARMACOLOGIC-STRESS) GATED LEFT VENTRICULAR WALL MOTION STUDY LEFT VENTRICULAR EJECTION FRACTION TECHNIQUE: Standard myocardial SPECT imaging was performed after resting intravenous injection of 10 mCi Tc-87m tetrofosmin. Subsequently, intravenous infusion of Lexiscan was  performed under the supervision of the Cardiology staff. At peak effect of the drug, 30 mCi Tc-94m tetrofosmin was injected intravenously and standard myocardial SPECT imaging was performed. Quantitative gated imaging was also performed to evaluate left ventricular wall motion, and estimate left ventricular ejection fraction. COMPARISON:  Plain film of 07/14/2017. FINDINGS: Perfusion: Rest images demonstrate an area of mild severity, medium-size decreased uptake within the mid inferior wall with extension into the inferolateral wall of the base. Stress images demonstrate an area of moderate severity, medium-size reversibility involving the apex. Wall Motion: Normal left ventricular wall motion. No left ventricular dilation. Left Ventricular Ejection Fraction: 54 % End diastolic volume 123 ml End systolic volume 56 ml IMPRESSION: 1. Area of reversibility involving the apex is suspicious for inducible ischemia. Fixed defect involving the inferior and inferolateral wall may represent diaphragmatic attenuation, given normal motion in this area. 2. Normal left ventricular wall motion. 3. Left ventricular ejection fraction  54% 4. Non invasive risk stratification*: Intermediate *2012 Appropriate Use Criteria for Coronary Revascularization Focused Update: J Am Coll Cardiol. 2012;59(9):857-881. http://content.dementiazones.comonlinejacc.org/article.aspx?articleid=1201161 These results will be called to the ordering clinician or representative by the Radiologist Assistant, and communication documented in the PACS or zVision Dashboard. Electronically Signed   By: Jeronimo GreavesKyle  Talbot M.D.   On: 07/15/2017 14:01        Scheduled Meds: . furosemide  20 mg Oral Daily  . heparin  5,000 Units Subcutaneous Q8H  . lisinopril  20 mg Oral Daily   And  . hydrochlorothiazide  12.5 mg Oral Daily  . insulin aspart  0-9 Units Subcutaneous TID WC  . multivitamin with minerals  1 tablet Oral Daily  . oxybutynin  5 mg Oral TID  . simvastatin  20 mg  Oral Daily  . tamsulosin  0.4 mg Oral Daily  . vitamin B-12  1,000 mcg Oral Daily   Continuous Infusions:   LOS: 0 days    Time spent: 35 minutes.     Alba CoryBelkys A Regalado, MD [facility] Pager [phone]  If 7PM-7AM, please contact night-coverage [email or web address] Password [email or web address] 07/15/2017, 2:35 PM

## 2017-07-15 NOTE — Progress Notes (Signed)
Cardiology fellow notified regarding K 3.4, pt SB/NSR 50s-60s with occassional PVCs. 60 mEq of replacement ordered. Will implement orders and continue to monitor.

## 2017-07-15 NOTE — Progress Notes (Signed)
ANTICOAGULATION CONSULT NOTE - Initial Consult  Pharmacy Consult for IV Heparin Indication: IABP  Allergies  Allergen Reactions  . Robitussin 12 Hour Cough [Dextromethorphan Polistirex Er] Nausea And Vomiting    Patient Measurements: Height: 5\' 10"  (177.8 cm) Weight: 214 lb 1 oz (97.1 kg) IBW/kg (Calculated) : 73 Heparin Dosing Weight: 93 kg  Vital Signs: Temp: 97.6 F (36.4 C) (12/07 1815) Temp Source: Oral (12/07 1815) BP: 125/59 (12/07 1815) Pulse Rate: 56 (12/07 1815)  Labs: Recent Labs    07/14/17 1102 07/14/17 1725 07/14/17 2140 07/15/17 0028 07/15/17 1538  HGB 14.9  --   --   --   --   HCT 42.7  --   --   --   --   PLT 204  --   --   --   --   LABPROT  --   --   --   --  13.1  INR  --   --   --   --  1.00  CREATININE 1.22  --   --   --   --   TROPONINI  --  <0.03 <0.03 <0.03  --     Estimated Creatinine Clearance: 53.6 mL/min (by C-G formula based on SCr of 1.22 mg/dL).   Medical History: Past Medical History:  Diagnosis Date  . Arthritis   . Atrial flutter (HCC) 11/20/2008   Qualifier: Diagnosis of  By: Johney FrameAllred, MD, Fayrene FearingJames    . Chest pain with moderate risk for cardiac etiology 07/14/2017  . Diabetes mellitus without complication (HCC)   . Hyperlipidemia   . Hypertension     Assessment: 83 year old male admitted 12/6 with chest pain and now s/p cardiac cath with IABP placement to start IV heparin per pharmacy consult- to start now despite TR band in radial due to IABP. CBC was stable on admission. INR 1. Will start at lower rate and target lower goal of 0.2 to 0.5 units/ml. Plan clarified with Dr. Excell Seltzerooper.    Goal of Therapy:  Heparin level 0.2 to 0.5 units/ml Monitor platelets by anticoagulation protocol: Yes   Plan:  Start IV Heparin at 950 units/hr. No bolus due to recent procedure.  Check heparin level in 8 hours.  Daily heparin level and CBC while on therapy.   Link SnufferJessica Maedell Hedger, PharmD, BCPS, BCCCP Clinical Pharmacist Clinical phone  07/15/2017 until [phone] After hours, please call [phone] 07/15/2017,6:34 PM

## 2017-07-15 NOTE — Progress Notes (Signed)
Cardiology plans to take patient to cath lab at this time. Due to time factor, RN able to get consent signed and ASA po given. Cath lab staff states they will take care of the rest of prep.   Son present in room at this time and is aware of cath plan.

## 2017-07-15 NOTE — Progress Notes (Signed)
Pt arrived to 3E from [facility] around 2230 via bed. Pt is A&Ox4, VSS, ambulates independently with a steady gait. White bracelet in place with correct identifiers, yellow falls bracelet placed upon arrival to floor. Pt oriented to room/rules/call light. Pt connected to telemetry and continuous O2 monitoring. Skin assessment completed with Marlinda Mikeallas, RN- intact. Call light within reach, bed alarm on for safety. Will ctm.

## 2017-07-15 NOTE — Interval H&P Note (Signed)
History and Physical Interval Note:  07/15/2017 4:29 PM  Cody CullensEdmund Lukin  has presented today for surgery, with the diagnosis of abnormal myoview  The various methods of treatment have been discussed with the patient and family. After consideration of risks, benefits and other options for treatment, the patient has consented to  Procedure(s): LEFT HEART CATH AND CORONARY ANGIOGRAPHY (N/A) as a surgical intervention .  The patient's history has been reviewed, patient examined, no change in status, stable for surgery.  I have reviewed the patient's chart and labs.  Questions were answered to the patient's satisfaction.     Tonny BollmanMichael Mahad Newstrom

## 2017-07-15 NOTE — H&P (View-Only) (Signed)
  Cardiology Consultation:   Patient ID: Cody Rivers; [id number]; 02/27/1932   Admit date: 07/14/2017 Date of Consult: 07/15/2017  Primary Care Provider: Meyers, Stephen, MD Primary Cardiologist: Dr. Skains-2010 Primary Electrophysiologist:  Dr. Allred-2010   Patient Profile:   Cody Rivers is a 83 y.o. male with a hx of Afib s/p ablation (2010),  HTN , DM Type II, urine incontinence, and arthritis, who is being seen today for the evaluation of abnormal myoview stress test completed 07/15/17 at the request of Dr. Regalado.   History of Present Illness:   Cody Rivers presented 12/06 with chest pain. It started yesterday morning, when he was getting out of bed. It reached a 2-3/10, it was a pressure and a numbness. He chewed a full strength aspirin. The pain lasted about 20 minutes. He had never had it before. He was a little SOB, no N&V or diaphoresis.  Enzymes were negative and ECG was not acute. He had a Lexiscan MV today that was positive for ischemia.  He did not have any chest pain during the stress test.   He rides and exercise bike regularly, does about 5 miles. He has not exercised in the last 2 weeks because of the weather. However, when he was exercising, he had no CP/SOB/DOE or other problems. He has been active all his life but did not exercise until after retirement. 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   He is currently resting comfortably.   He had fish, cole slaw, tapioca pudding and angel cake with strawberry sauce for lunch a little after 12.    Past Medical History:  Diagnosis Date  . Arthritis   . Atrial flutter (HCC) 11/20/2008   Qualifier: Diagnosis of  By: Allred, MD, James    . Chest pain with moderate risk for cardiac etiology 07/14/2017  . Diabetes mellitus without complication (HCC)   . Hyperlipidemia   . Hypertension     Past Surgical History:  Procedure Laterality Date  . ATRIAL FLUTTER ABLATION  2010   Dr Allred  . REPLACEMENT TOTAL KNEE  BILATERAL    . ROTATOR CUFF REPAIR     LEFT     Prior to Admission medications   Medication Sig Start Date End Date Taking? Authorizing Provider  calcium carbonate (TUMS - DOSED IN MG ELEMENTAL CALCIUM) 500 MG chewable tablet Chew 1 tablet by mouth daily.   Yes [provider]  docusate sodium (COLACE) 100 MG capsule Take 100 mg by mouth 2 (two) times daily.   Yes [provider]  furosemide (LASIX) 20 MG tablet Take 20 mg by mouth daily.   Yes [provider]  glyBURIDE-metformin (GLUCOVANCE) 2.5-500 MG tablet Take 1 tablet by mouth daily with breakfast.   Yes [provider]  Hypromellose (ARTIFICIAL TEARS OP) Place 1 drop into both eyes daily as needed (dry eyes).   Yes [provider]  ibuprofen (ADVIL,MOTRIN) 200 MG tablet Take 200 mg by mouth every 6 (six) hours as needed for moderate pain.   Yes [provider]  ketoconazole (NIZORAL) 2 % cream Apply 1 application topically daily. To foot rash 07/05/17  Yes [provider]  lisinopril-hydrochlorothiazide (PRINZIDE,ZESTORETIC) 20-12.5 MG tablet Take 1 tablet by mouth daily.   Yes [provider]  Multiple Vitamin (MULTIVITAMIN) tablet Take 1 tablet by mouth daily.   Yes [provider]  Multiple Vitamins-Minerals (PRESERVISION AREDS PO) Take 1 tablet by mouth daily.   Yes [provider]  naproxen sodium (ALEVE) 220 MG   tablet Take 220 mg by mouth 2 (two) times daily with a meal.   Yes [provider]  oxybutynin (DITROPAN) 5 MG tablet Take 5 mg by mouth 3 (three) times daily.   Yes [provider]  simvastatin (ZOCOR) 20 MG tablet Take 20 mg by mouth daily.   Yes [provider]  tamsulosin (FLOMAX) 0.4 MG CAPS capsule Take 0.4 mg by mouth daily.   Yes [provider]  vitamin A 10000 UNIT capsule Take 10,000 Units by mouth daily.   Yes [provider]  vitamin B-12 (CYANOCOBALAMIN) 1000 MCG tablet Take  1,000 mcg by mouth daily.   Yes [provider]  amoxicillin-clavulanate (AUGMENTIN) 875-125 MG tablet Take 1 tablet by mouth every 12 (twelve) hours. Patient not taking: Reported on 07/14/2017 09/20/15   Beaton, Robert, MD  HYDROcodone-acetaminophen (NORCO/VICODIN) 5-325 MG tablet Take 1-2 tablets by mouth every 4 (four) hours as needed for severe pain. Patient not taking: Reported on 07/14/2017 09/20/15   Beaton, Robert, MD    Inpatient Medications: Scheduled Meds: . aspirin  81 mg Oral Pre-Cath  . furosemide  20 mg Oral Daily  . heparin  5,000 Units Subcutaneous Q8H  . insulin aspart  0-9 Units Subcutaneous TID WC  . lisinopril  20 mg Oral Daily  . multivitamin with minerals  1 tablet Oral Daily  . oxybutynin  5 mg Oral TID  . simvastatin  20 mg Oral Daily  . sodium chloride flush  3 mL Intravenous Q12H  . tamsulosin  0.4 mg Oral Daily  . vitamin B-12  1,000 mcg Oral Daily   Continuous Infusions: . sodium chloride    . sodium chloride     Followed by  . sodium chloride     PRN Meds: sodium chloride, acetaminophen, ALPRAZolam, morphine injection, ondansetron (ZOFRAN) IV, polyvinyl alcohol, sodium chloride flush  Allergies:    Allergies  Allergen Reactions  . Robitussin 12 Hour Cough [Dextromethorphan Polistirex Er] Nausea And Vomiting    Social History:   Social History   Socioeconomic History  . Marital status: Widowed    Spouse name: Not on file  . Number of children: Not on file  . Years of education: Not on file  . Highest education level: Not on file  Social Needs  . Financial resource strain: Not on file  . Food insecurity - worry: Not on file  . Food insecurity - inability: Not on file  . Transportation needs - medical: Not on file  . Transportation needs - non-medical: Not on file  Occupational History  . Occupation: retired  Tobacco Use  . Smoking status: Never Smoker  . Smokeless tobacco: Never Used  Substance and Sexual Activity  . Alcohol  use: Yes  . Drug use: No  . Sexual activity: No  Other Topics Concern  . Not on file  Social History Narrative   Pt lives with his son.     Family History:   Family History  Problem Relation Age of Onset  . Cancer Father   . Diabetes Maternal Grandfather 90   Family Status:  Family Status  Relation Name Status  . Mother  Deceased  . Father  Deceased  . MGF  Deceased    ROS:  Please see the history of present illness.  All other ROS reviewed and negative.     Physical Exam/Data:   Vitals:   07/15/17 0928 07/15/17 0930 07/15/17 0932 07/15/17 1540  BP: (!) 142/89 134/73 (!) 153/74   Pulse:        Resp:      Temp:      TempSrc:      SpO2:      Weight:    214 lb 1 oz (97.1 kg)  Height:        Intake/Output Summary (Last 24 hours) at 07/15/2017 1541 Last data filed at 07/15/2017 0900 Gross per 24 hour  Intake 0 ml  Output 450 ml  Net -450 ml   Filed Weights   07/14/17 2227 07/15/17 0624 07/15/17 1540  Weight: 214 lb 11.2 oz (97.4 kg) 213 lb 8 oz (96.8 kg) 214 lb 1 oz (97.1 kg)   Body mass index is 30.71 kg/m.  General:  Well nourished, well developed, in no acute distress HEENT: normal Lymph: no adenopathy Neck: no JVD Endocrine:  No thryomegaly Vascular: No carotid bruits; 4/4 extremity pulses 2+ Cardiac:  normal S1, S2; RRR; no murmur  Lungs:  clear to auscultation bilaterally, no wheezing, rhonchi or rales  Abd: soft, nontender, no hepatomegaly  Ext: no edema Musculoskeletal:  No deformities, BUE and BLE strength normal and equal Skin: warm and dry  Neuro:  CNs 2-12 intact, no focal abnormalities noted Psych:  Normal affect   EKG:  The EKG was personally reviewed and demonstrates:  07/15/2017, SR, HR 61, no acute ischemic changes, minor ST changes not significantly different from 2010 Telemetry:  Telemetry was personally reviewed and demonstrates:  SR  Relevant CV Studies:  ECHO: n/a  CATH: scheduled  Laboratory Data:  Chemistry Recent Labs    Lab 07/14/17 1102  NA 137  K 3.9  CL 105  CO2 22  GLUCOSE 165*  BUN 20  CREATININE 1.22  CALCIUM 10.1  GFRNONAA 53*  GFRAA >60  ANIONGAP 10    Hematology Recent Labs  Lab 07/14/17 1102  WBC 9.0  RBC 4.93  HGB 14.9  HCT 42.7  MCV 86.6  MCH 30.2  MCHC 34.9  RDW 12.9  PLT 204   Cardiac Enzymes Recent Labs  Lab 07/14/17 1725 07/14/17 2140 07/15/17 0028  TROPONINI <0.03 <0.03 <0.03    Recent Labs  Lab 07/14/17 1107  TROPIPOC 0.01    Lipids: Lab Results  Component Value Date   CHOL 126 07/14/2017   HDL 36 (L) 07/14/2017   LDLCALC 52 07/14/2017   TRIG 188 (H) 07/14/2017   CHOLHDL 3.5 07/14/2017   HgbA1c: Lab Results  Component Value Date   HGBA1C 6.7 (H) 07/14/2017    Radiology/Studies:  Dg Chest 2 View  Result Date: 07/14/2017 CLINICAL DATA:  Chest pain EXAM: CHEST  2 VIEW COMPARISON:  07/25/2012 CT FINDINGS: Heart and mediastinal contours are within normal limits. No focal opacities or effusions. No acute bony abnormality. IMPRESSION: No active cardiopulmonary disease. Electronically Signed   By: Kevin  Dover M.D.   On: 07/14/2017 11:38   Nm Myocar Multi W/spect W/wall Motion / Ef  Result Date: 07/15/2017 CLINICAL DATA:  Chest pain. Diabetes. Hypertension. Elevated lipids. EXAM: MYOCARDIAL IMAGING WITH SPECT (REST AND PHARMACOLOGIC-STRESS) GATED LEFT VENTRICULAR WALL MOTION STUDY LEFT VENTRICULAR EJECTION FRACTION TECHNIQUE: Standard myocardial SPECT imaging was performed after resting intravenous injection of 10 mCi Tc-99m tetrofosmin. Subsequently, intravenous infusion of Lexiscan was performed under the supervision of the Cardiology staff. At peak effect of the drug, 30 mCi Tc-99m tetrofosmin was injected intravenously and standard myocardial SPECT imaging was performed. Quantitative gated imaging was also performed to evaluate left ventricular wall motion, and estimate left ventricular ejection fraction. COMPARISON:  Plain film of 07/14/2017. FINDINGS:  Perfusion: Rest   images demonstrate an area of mild severity, medium-size decreased uptake within the mid inferior wall with extension into the inferolateral wall of the base. Stress images demonstrate an area of moderate severity, medium-size reversibility involving the apex. Wall Motion: Normal left ventricular wall motion. No left ventricular dilation. Left Ventricular Ejection Fraction: 54 % End diastolic volume 123 ml End systolic volume 56 ml IMPRESSION: 1. Area of reversibility involving the apex is suspicious for inducible ischemia. Fixed defect involving the inferior and inferolateral wall may represent diaphragmatic attenuation, given normal motion in this area. 2. Normal left ventricular wall motion. 3. Left ventricular ejection fraction 54% 4. Non invasive risk stratification*: Intermediate *2012 Appropriate Use Criteria for Coronary Revascularization Focused Update: J Am Coll Cardiol. 2012;59(9):857-881. http://content.onlinejacc.org/article.aspx?articleid=1201161 These results will be called to the ordering clinician or representative by the Radiologist Assistant, and communication documented in the PACS or zVision Dashboard. Electronically Signed   By: Kyle  Talbot M.D.   On: 07/15/2017 14:01    Assessment and Plan:   Principal Problem: 1.  Chest pain with moderate risk for cardiac etiology - CRFs are age, HTN, DM - sx w/ typical and atypical features, ez neg MI and ECG not acute - However, MV results above, inferior ischemia noted.  - pt currently pain-free - plan cath today - The risks and benefits of a cardiac catheterization including, but not limited to, death, stroke, MI, kidney damage and bleeding were discussed with the patient who indicates understanding and agrees to proceed.   Active Problems: 2.  Abnormal nuclear stress test - see above, if cath is clean, he should not have another MV  Otherwise, per IM   Diabetes (HCC)   Hyperlipidemia   Atrial flutter (HCC)    Hypertension   For questions or updates, please contact [facility] Please consult [email or web address] for contact info under Cardiology/STEMI.   Signed, Rhonda Barrett, PA-C  07/15/2017 3:41 PM  Personally seen and examined. Agree with above.  83 year old who I saw about 9 years ago for atrial flutter, post ablation by Dr. Allred who came in with angina as described about. Stress test performed, abnormal, possible ischemia. Normally quite active, bike.  Exam: RRR, normal radial pulse, lungs clear, no edema.  ECG personally viewed.    - Reasonable to pursue cath. Risks and benefits discussed. Radial.   Cody Skains, MD  

## 2017-07-15 NOTE — Progress Notes (Signed)
    Cardiology consulted for evaluation of chest pain. Enzymes negative and EKG showed no acute ischemic changes. A nuclear stress test has already been ordered by the Hospitalist. Cardiology will assist with performing the stress test. Will do a formal consult if stress test is abnormal.   Signed, Ellsworth LennoxBrittany M Strader, PA-C 07/15/2017, 10:10 AM

## 2017-07-15 NOTE — Consult Note (Signed)
Cardiology Consultation:   Patient ID: Cody Rivers; [id number]; [birth year]   Admit date: 07/14/2017 Date of Consult: 07/15/2017  Primary Care Provider: Joycelyn Rua, MD Primary Cardiologist: Dr. Ardyth Rivers Primary Electrophysiologist:  Dr. Dennison Rivers   Patient Profile:   Cody Rivers is a 83 y.o. male with a hx of Afib s/p ablation (2010),  HTN , DM Type II, urine incontinence, and arthritis, who is being seen today for the evaluation of abnormal myoview stress test completed 07/15/17 at the request of Dr. Sunnie Rivers.   History of Present Illness:   Cody Rivers presented 12/06 with chest pain. It started yesterday morning, when he was getting out of bed. It reached a 2-3/10, it was a pressure and a numbness. He chewed a full strength aspirin. The pain lasted about 20 minutes. He had never had it before. He was a little SOB, no N&V or diaphoresis.  Enzymes were negative and ECG was not acute. He had a Lexiscan MV today that was positive for ischemia.  He did not have any chest pain during the stress test.   He rides and exercise bike regularly, does about 5 miles. He has not exercised in the last 2 weeks because of the weather. However, when he was exercising, he had no CP/SOB/DOE or other problems. He has been active all his life but did not exercise until after retirement. 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   He is currently resting comfortably.   He had fish, cole slaw, tapioca pudding and angel cake with strawberry sauce for lunch a little after 12.    Past Medical History:  Diagnosis Date  . Arthritis   . Atrial flutter (HCC) 11/20/2008   Qualifier: Diagnosis of  By: Cody Frame, MD, Fayrene Fearing    . Chest pain with moderate risk for cardiac etiology 07/14/2017  . Diabetes mellitus without complication (HCC)   . Hyperlipidemia   . Hypertension     Past Surgical History:  Procedure Laterality Date  . ATRIAL FLUTTER ABLATION  2010   Dr Cody Rivers  . REPLACEMENT TOTAL KNEE  BILATERAL    . ROTATOR CUFF REPAIR     LEFT     Prior to Admission medications   Medication Sig Start Date End Date Taking? Authorizing Provider  calcium carbonate (TUMS - DOSED IN MG ELEMENTAL CALCIUM) 500 MG chewable tablet Chew 1 tablet by mouth daily.   Yes [provider]  docusate sodium (COLACE) 100 MG capsule Take 100 mg by mouth 2 (two) times daily.   Yes [provider]  furosemide (LASIX) 20 MG tablet Take 20 mg by mouth daily.   Yes [provider]  glyBURIDE-metformin (GLUCOVANCE) 2.5-500 MG tablet Take 1 tablet by mouth daily with breakfast.   Yes [provider]  Hypromellose (ARTIFICIAL TEARS OP) Place 1 drop into both eyes daily as needed (dry eyes).   Yes [provider]  ibuprofen (ADVIL,MOTRIN) 200 MG tablet Take 200 mg by mouth every 6 (six) hours as needed for moderate pain.   Yes [provider]  ketoconazole (NIZORAL) 2 % cream Apply 1 application topically daily. To foot rash 07/05/17  Yes [provider]  lisinopril-hydrochlorothiazide (PRINZIDE,ZESTORETIC) 20-12.5 MG tablet Take 1 tablet by mouth daily.   Yes [provider]  Multiple Vitamin (MULTIVITAMIN) tablet Take 1 tablet by mouth daily.   Yes [provider]  Multiple Vitamins-Minerals (PRESERVISION AREDS PO) Take 1 tablet by mouth daily.   Yes [provider]  naproxen sodium (ALEVE) 220 MG  tablet Take 220 mg by mouth 2 (two) times daily with a meal.   Yes [provider]  oxybutynin (DITROPAN) 5 MG tablet Take 5 mg by mouth 3 (three) times daily.   Yes [provider]  simvastatin (ZOCOR) 20 MG tablet Take 20 mg by mouth daily.   Yes [provider]  tamsulosin (FLOMAX) 0.4 MG CAPS capsule Take 0.4 mg by mouth daily.   Yes [provider]  vitamin A 1610910000 UNIT capsule Take 10,000 Units by mouth daily.   Yes [provider]  vitamin B-12 (CYANOCOBALAMIN) 1000 MCG tablet Take  1,000 mcg by mouth daily.   Yes [provider]  amoxicillin-clavulanate (AUGMENTIN) 875-125 MG tablet Take 1 tablet by mouth every 12 (twelve) hours. Patient not taking: Reported on 07/14/2017 09/20/15   Cody Rivers, Robert, MD  HYDROcodone-acetaminophen (NORCO/VICODIN) 5-325 MG tablet Take 1-2 tablets by mouth every 4 (four) hours as needed for severe pain. Patient not taking: Reported on 07/14/2017 09/20/15   Cody Rivers, Robert, MD    Inpatient Medications: Scheduled Meds: . aspirin  81 mg Oral Pre-Cath  . furosemide  20 mg Oral Daily  . heparin  5,000 Units Subcutaneous Q8H  . insulin aspart  0-9 Units Subcutaneous TID WC  . lisinopril  20 mg Oral Daily  . multivitamin with minerals  1 tablet Oral Daily  . oxybutynin  5 mg Oral TID  . simvastatin  20 mg Oral Daily  . sodium chloride flush  3 mL Intravenous Q12H  . tamsulosin  0.4 mg Oral Daily  . vitamin B-12  1,000 mcg Oral Daily   Continuous Infusions: . sodium chloride    . sodium chloride     Followed by  . sodium chloride     PRN Meds: sodium chloride, acetaminophen, ALPRAZolam, morphine injection, ondansetron (ZOFRAN) IV, polyvinyl alcohol, sodium chloride flush  Allergies:    Allergies  Allergen Reactions  . Robitussin 12 Hour Cough [Dextromethorphan Polistirex Er] Nausea And Vomiting    Social History:   Social History   Socioeconomic History  . Marital status: Widowed    Spouse name: Not on file  . Number of children: Not on file  . Years of education: Not on file  . Highest education level: Not on file  Social Needs  . Financial resource strain: Not on file  . Food insecurity - worry: Not on file  . Food insecurity - inability: Not on file  . Transportation needs - medical: Not on file  . Transportation needs - non-medical: Not on file  Occupational History  . Occupation: retired  Tobacco Use  . Smoking status: Never Smoker  . Smokeless tobacco: Never Used  Substance and Sexual Activity  . Alcohol  use: Yes  . Drug use: No  . Sexual activity: No  Other Topics Concern  . Not on file  Social History Narrative   Pt lives with his son.     Family History:   Family History  Problem Relation Age of Onset  . Cancer Father   . Diabetes Maternal Grandfather [id number]   Family Status:  Family Status  Relation Name Status  . Mother  Deceased  . Father  Deceased  . MGF  Deceased    ROS:  Please see the history of present illness.  All other ROS reviewed and negative.     Physical Exam/Data:   Vitals:   07/15/17 0928 07/15/17 0930 07/15/17 0932 07/15/17 1540  BP: (!) 142/89 134/73 (!) 153/74   Pulse:  Resp:      Temp:      TempSrc:      SpO2:      Weight:    214 lb 1 oz (97.1 kg)  Height:        Intake/Output Summary (Last 24 hours) at 07/15/2017 1541 Last data filed at 07/15/2017 0900 Gross per 24 hour  Intake 0 ml  Output 450 ml  Net -450 ml   Filed Weights   07/14/17 2227 07/15/17 0624 07/15/17 1540  Weight: 214 lb 11.2 oz (97.4 kg) 213 lb 8 oz (96.8 kg) 214 lb 1 oz (97.1 kg)   Body mass index is 30.71 kg/m.  General:  Well nourished, well developed, in no acute distress HEENT: normal Lymph: no adenopathy Neck: no JVD Endocrine:  No thryomegaly Vascular: No carotid bruits; 4/4 extremity pulses 2+ Cardiac:  normal S1, S2; RRR; no murmur  Lungs:  clear to auscultation bilaterally, no wheezing, rhonchi or rales  Abd: soft, nontender, no hepatomegaly  Ext: no edema Musculoskeletal:  No deformities, BUE and BLE strength normal and equal Skin: warm and dry  Neuro:  CNs 2-12 intact, no focal abnormalities noted Psych:  Normal affect   EKG:  The EKG was personally reviewed and demonstrates:  07/15/2017, SR, HR 61, no acute ischemic changes, minor ST changes not significantly different from 2010 Telemetry:  Telemetry was personally reviewed and demonstrates:  SR  Relevant CV Studies:  ECHO: n/a  CATH: scheduled  Laboratory Data:  Chemistry Recent Labs    Lab 07/14/17 1102  NA 137  K 3.9  CL 105  CO2 22  GLUCOSE 165*  BUN 20  CREATININE 1.22  CALCIUM 10.1  GFRNONAA 53*  GFRAA >60  ANIONGAP 10    Hematology Recent Labs  Lab 07/14/17 1102  WBC 9.0  RBC 4.93  HGB 14.9  HCT 42.7  MCV 86.6  MCH 30.2  MCHC 34.9  RDW 12.9  PLT 204   Cardiac Enzymes Recent Labs  Lab 07/14/17 1725 07/14/17 2140 07/15/17 0028  TROPONINI <0.03 <0.03 <0.03    Recent Labs  Lab 07/14/17 1107  TROPIPOC 0.01    Lipids: Lab Results  Component Value Date   CHOL 126 07/14/2017   HDL 36 (L) 07/14/2017   LDLCALC 52 07/14/2017   TRIG 188 (H) 07/14/2017   CHOLHDL 3.5 07/14/2017   HgbA1c: Lab Results  Component Value Date   HGBA1C 6.7 (H) 07/14/2017    Radiology/Studies:  Dg Chest 2 View  Result Date: 07/14/2017 CLINICAL DATA:  Chest pain EXAM: CHEST  2 VIEW COMPARISON:  07/25/2012 CT FINDINGS: Heart and mediastinal contours are within normal limits. No focal opacities or effusions. No acute bony abnormality. IMPRESSION: No active cardiopulmonary disease. Electronically Signed   By: Charlett NoseKevin  Dover M.D.   On: 07/14/2017 11:38   Nm Myocar Multi W/spect W/wall Motion / Ef  Result Date: 07/15/2017 CLINICAL DATA:  Chest pain. Diabetes. Hypertension. Elevated lipids. EXAM: MYOCARDIAL IMAGING WITH SPECT (REST AND PHARMACOLOGIC-STRESS) GATED LEFT VENTRICULAR WALL MOTION STUDY LEFT VENTRICULAR EJECTION FRACTION TECHNIQUE: Standard myocardial SPECT imaging was performed after resting intravenous injection of 10 mCi Tc-2156m tetrofosmin. Subsequently, intravenous infusion of Lexiscan was performed under the supervision of the Cardiology staff. At peak effect of the drug, 30 mCi Tc-2756m tetrofosmin was injected intravenously and standard myocardial SPECT imaging was performed. Quantitative gated imaging was also performed to evaluate left ventricular wall motion, and estimate left ventricular ejection fraction. COMPARISON:  Plain film of 07/14/2017. FINDINGS:  Perfusion: Rest  images demonstrate an area of mild severity, medium-size decreased uptake within the mid inferior wall with extension into the inferolateral wall of the base. Stress images demonstrate an area of moderate severity, medium-size reversibility involving the apex. Wall Motion: Normal left ventricular wall motion. No left ventricular dilation. Left Ventricular Ejection Fraction: 54 % End diastolic volume 123 ml End systolic volume 56 ml IMPRESSION: 1. Area of reversibility involving the apex is suspicious for inducible ischemia. Fixed defect involving the inferior and inferolateral wall may represent diaphragmatic attenuation, given normal motion in this area. 2. Normal left ventricular wall motion. 3. Left ventricular ejection fraction 54% 4. Non invasive risk stratification*: Intermediate *2012 Appropriate Use Criteria for Coronary Revascularization Focused Update: J Am Coll Cardiol. 2012;59(9):857-881. http://content.dementiazones.com.aspx?articleid=1201161 These results will be called to the ordering clinician or representative by the Radiologist Assistant, and communication documented in the PACS or zVision Dashboard. Electronically Signed   By: Jeronimo Greaves M.D.   On: 07/15/2017 14:01    Assessment and Plan:   Principal Problem: 1.  Chest pain with moderate risk for cardiac etiology - CRFs are age, HTN, DM - sx w/ typical and atypical features, ez neg MI and ECG not acute - However, MV results above, inferior ischemia noted.  - pt currently pain-free - plan cath today - The risks and benefits of a cardiac catheterization including, but not limited to, death, stroke, MI, kidney damage and bleeding were discussed with the patient who indicates understanding and agrees to proceed.   Active Problems: 2.  Abnormal nuclear stress test - see above, if cath is clean, he should not have another MV  Otherwise, per IM   Diabetes (HCC)   Hyperlipidemia   Atrial flutter (HCC)    Hypertension   For questions or updates, please contact [facility] Please consult [email or web address] for contact info under Cardiology/STEMI.   Signed, Theodore Demark, PA-C  07/15/2017 3:41 PM  Personally seen and examined. Agree with above.  83 year old who I saw about 9 years ago for atrial flutter, post ablation by Dr. Johney Rivers who came in with angina as described about. Stress test performed, abnormal, possible ischemia. Normally quite active, bike.  Exam: RRR, normal radial pulse, lungs clear, no edema.  ECG personally viewed.    - Reasonable to pursue cath. Risks and benefits discussed. Radial.   Donato Schultz, MD

## 2017-07-15 NOTE — Progress Notes (Signed)
1 day lexiscan myoview completed without significant complication. [facility]  Signed, Azalee CourseHao Diesel Lina PA Pager: [phone]

## 2017-07-16 ENCOUNTER — Inpatient Hospital Stay (HOSPITAL_COMMUNITY): Payer: Medicare Other

## 2017-07-16 ENCOUNTER — Inpatient Hospital Stay (HOSPITAL_COMMUNITY): Payer: Medicare Other | Admitting: Certified Registered Nurse Anesthetist

## 2017-07-16 ENCOUNTER — Other Ambulatory Visit: Payer: Self-pay

## 2017-07-16 ENCOUNTER — Inpatient Hospital Stay (HOSPITAL_COMMUNITY)
Admission: EM | Disposition: A | Discharge status: Home / Self Care | Payer: Self-pay | Source: Home / Self Care | Attending: Surgery

## 2017-07-16 ENCOUNTER — Encounter (HOSPITAL_COMMUNITY): Payer: Self-pay | Admitting: *Deleted

## 2017-07-16 DIAGNOSIS — Z951 Presence of aortocoronary bypass graft: Secondary | ICD-10-CM

## 2017-07-16 DIAGNOSIS — I25110 Atherosclerotic heart disease of native coronary artery with unstable angina pectoris: Secondary | ICD-10-CM

## 2017-07-16 HISTORY — PX: CORONARY ARTERY BYPASS GRAFT: SHX141

## 2017-07-16 HISTORY — PX: TEE WITHOUT CARDIOVERSION: SHX5443

## 2017-07-16 LAB — GLUCOSE, CAPILLARY
GLUCOSE-CAPILLARY: 99 mg/dL (ref 65–99)
GLUCOSE-CAPILLARY: 107 mg/dL — AB (ref 65–99)
GLUCOSE-CAPILLARY: 107 mg/dL — AB (ref 65–99)
GLUCOSE-CAPILLARY: 96 mg/dL (ref 65–99)
Glucose-Capillary: 172 mg/dL — ABNORMAL HIGH (ref 65–99)
Glucose-Capillary: 114 mg/dL — ABNORMAL HIGH (ref 65–99)

## 2017-07-16 LAB — POCT I-STAT 3, ART BLOOD GAS (G3+)
ACID-BASE DEFICIT: 4 mmol/L — AB (ref 0.0–2.0)
ACID-BASE DEFICIT: 6 mmol/L — AB (ref 0.0–2.0)
Acid-base deficit: 4 mmol/L — ABNORMAL HIGH (ref 0.0–2.0)
BICARBONATE: 21.4 mmol/L (ref 20.0–28.0)
Bicarbonate: 18 mmol/L — ABNORMAL LOW (ref 20.0–28.0)
Bicarbonate: 25.3 mmol/L (ref 20.0–28.0)
Bicarbonate: 20.4 mmol/L (ref 20.0–28.0)
O2 SAT: 97 %
O2 SAT: 99 %
O2 SAT: 98 %
O2 Saturation: 100 %
PCO2 ART: 25.1 mmHg — AB (ref 32.0–48.0)
PO2 ART: 80 mmHg — AB (ref 83.0–108.0)
Patient temperature: 98.6
TCO2: 19 mmol/L — AB (ref 22–32)
TCO2: 27 mmol/L (ref 22–32)
TCO2: 23 mmol/L (ref 22–32)
TCO2: 22 mmol/L (ref 22–32)
pCO2 arterial: 43.4 mmHg (ref 32.0–48.0)
pCO2 arterial: 39.6 mmHg (ref 32.0–48.0)
pCO2 arterial: 39.8 mmHg (ref 32.0–48.0)
pH, Arterial: 7.463 — ABNORMAL HIGH (ref 7.350–7.450)
pH, Arterial: 7.374 (ref 7.350–7.450)
pH, Arterial: 7.341 — ABNORMAL LOW (ref 7.350–7.450)
pH, Arterial: 7.313 — ABNORMAL LOW (ref 7.350–7.450)
pO2, Arterial: 451 mmHg — ABNORMAL HIGH (ref 83.0–108.0)
pO2, Arterial: 161 mmHg — ABNORMAL HIGH (ref 83.0–108.0)
pO2, Arterial: 112 mmHg — ABNORMAL HIGH (ref 83.0–108.0)

## 2017-07-16 LAB — COMPREHENSIVE METABOLIC PANEL
ALBUMIN: 3.6 g/dL (ref 3.5–5.0)
ALK PHOS: 57 U/L (ref 38–126)
ALT: 18 U/L (ref 17–63)
AST: 22 U/L (ref 15–41)
Anion gap: 8 (ref 5–15)
BILIRUBIN TOTAL: 0.6 mg/dL (ref 0.3–1.2)
BUN: 19 mg/dL (ref 6–20)
CO2: 21 mmol/L — ABNORMAL LOW (ref 22–32)
CREATININE: 1.27 mg/dL — AB (ref 0.61–1.24)
Calcium: 9.6 mg/dL (ref 8.9–10.3)
Chloride: 107 mmol/L (ref 101–111)
GFR calc Af Amer: 59 mL/min — ABNORMAL LOW (ref >60)
GFR calc non Af Amer: 50 mL/min — ABNORMAL LOW (ref >60)
GLUCOSE: 163 mg/dL — AB (ref 65–99)
Potassium: 4.1 mmol/L (ref 3.5–5.1)
Sodium: 136 mmol/L (ref 135–145)
TOTAL PROTEIN: 6.4 g/dL — AB (ref 6.5–8.1)

## 2017-07-16 LAB — POCT I-STAT, CHEM 8
BUN: 19 mg/dL (ref 6–20)
BUN: 17 mg/dL (ref 6–20)
BUN: 18 mg/dL (ref 6–20)
BUN: 16 mg/dL (ref 6–20)
BUN: 17 mg/dL (ref 6–20)
CALCIUM ION: 1.19 mmol/L (ref 1.15–1.40)
CHLORIDE: 106 mmol/L (ref 101–111)
CHLORIDE: 107 mmol/L (ref 101–111)
CHLORIDE: 106 mmol/L (ref 101–111)
CREATININE: 0.9 mg/dL (ref 0.61–1.24)
Calcium, Ion: 1.33 mmol/L (ref 1.15–1.40)
Calcium, Ion: 1.15 mmol/L (ref 1.15–1.40)
Calcium, Ion: 1.32 mmol/L (ref 1.15–1.40)
Calcium, Ion: 1.18 mmol/L (ref 1.15–1.40)
Chloride: 106 mmol/L (ref 101–111)
Chloride: 105 mmol/L (ref 101–111)
Creatinine, Ser: 1.1 mg/dL (ref 0.61–1.24)
Creatinine, Ser: 0.8 mg/dL (ref 0.61–1.24)
Creatinine, Ser: 1 mg/dL (ref 0.61–1.24)
Creatinine, Ser: 0.8 mg/dL (ref 0.61–1.24)
GLUCOSE: 162 mg/dL — AB (ref 65–99)
Glucose, Bld: 146 mg/dL — ABNORMAL HIGH (ref 65–99)
Glucose, Bld: 138 mg/dL — ABNORMAL HIGH (ref 65–99)
Glucose, Bld: 154 mg/dL — ABNORMAL HIGH (ref 65–99)
Glucose, Bld: 175 mg/dL — ABNORMAL HIGH (ref 65–99)
HCT: 37 % — ABNORMAL LOW (ref 39.0–52.0)
HCT: 29 % — ABNORMAL LOW (ref 39.0–52.0)
HCT: 29 % — ABNORMAL LOW (ref 39.0–52.0)
HCT: 31 % — ABNORMAL LOW (ref 39.0–52.0)
HEMATOCRIT: 36 % — AB (ref 39.0–52.0)
HEMOGLOBIN: 12.6 g/dL — AB (ref 13.0–17.0)
Hemoglobin: 12.2 g/dL — ABNORMAL LOW (ref 13.0–17.0)
Hemoglobin: 9.9 g/dL — ABNORMAL LOW (ref 13.0–17.0)
Hemoglobin: 9.9 g/dL — ABNORMAL LOW (ref 13.0–17.0)
Hemoglobin: 10.5 g/dL — ABNORMAL LOW (ref 13.0–17.0)
POTASSIUM: 3.8 mmol/L (ref 3.5–5.1)
POTASSIUM: 4 mmol/L (ref 3.5–5.1)
Potassium: 4 mmol/L (ref 3.5–5.1)
Potassium: 4.7 mmol/L (ref 3.5–5.1)
Potassium: 4.1 mmol/L (ref 3.5–5.1)
SODIUM: 142 mmol/L (ref 135–145)
SODIUM: 142 mmol/L (ref 135–145)
Sodium: 141 mmol/L (ref 135–145)
Sodium: 140 mmol/L (ref 135–145)
Sodium: 142 mmol/L (ref 135–145)
TCO2: 23 mmol/L (ref 22–32)
TCO2: 22 mmol/L (ref 22–32)
TCO2: 27 mmol/L (ref 22–32)
TCO2: 26 mmol/L (ref 22–32)
TCO2: 22 mmol/L (ref 22–32)

## 2017-07-16 LAB — CBC
HEMATOCRIT: 39.3 % (ref 39.0–52.0)
HEMATOCRIT: 31.7 % — AB (ref 39.0–52.0)
HEMOGLOBIN: 13.4 g/dL (ref 13.0–17.0)
HEMOGLOBIN: 10.6 g/dL — AB (ref 13.0–17.0)
MCH: 29.6 pg (ref 26.0–34.0)
MCH: 29.4 pg (ref 26.0–34.0)
MCHC: 34.1 g/dL (ref 30.0–36.0)
MCHC: 33.4 g/dL (ref 30.0–36.0)
MCV: 86.9 fL (ref 78.0–100.0)
MCV: 87.8 fL (ref 78.0–100.0)
Platelets: 169 10*3/uL (ref 150–400)
Platelets: 119 10*3/uL — ABNORMAL LOW (ref 150–400)
RBC: 4.52 MIL/uL (ref 4.22–5.81)
RBC: 3.61 MIL/uL — ABNORMAL LOW (ref 4.22–5.81)
RDW: 13.1 % (ref 11.5–15.5)
RDW: 13.1 % (ref 11.5–15.5)
WBC: 10.5 10*3/uL (ref 4.0–10.5)
WBC: 17.3 10*3/uL — ABNORMAL HIGH (ref 4.0–10.5)

## 2017-07-16 LAB — PROTIME-INR
INR: 1.07
INR: 1.3
Prothrombin Time: 13.8 seconds (ref 11.4–15.2)
Prothrombin Time: 16.1 seconds — ABNORMAL HIGH (ref 11.4–15.2)

## 2017-07-16 LAB — SURGICAL PCR SCREEN
MRSA, PCR: NEGATIVE
STAPHYLOCOCCUS AUREUS: NEGATIVE

## 2017-07-16 LAB — BASIC METABOLIC PANEL
Anion gap: 9 (ref 5–15)
BUN: 21 mg/dL — AB (ref 6–20)
CHLORIDE: 106 mmol/L (ref 101–111)
CO2: 22 mmol/L (ref 22–32)
CREATININE: 1.3 mg/dL — AB (ref 0.61–1.24)
Calcium: 9.4 mg/dL (ref 8.9–10.3)
GFR calc Af Amer: 57 mL/min — ABNORMAL LOW (ref >60)
GFR calc non Af Amer: 49 mL/min — ABNORMAL LOW (ref >60)
Glucose, Bld: 160 mg/dL — ABNORMAL HIGH (ref 65–99)
Potassium: 3.9 mmol/L (ref 3.5–5.1)
Sodium: 137 mmol/L (ref 135–145)

## 2017-07-16 LAB — ECHO INTRAOPERATIVE TEE
Height: 70 in
Weight: 3425 oz

## 2017-07-16 LAB — APTT
aPTT: 41 seconds — ABNORMAL HIGH (ref 24–36)
aPTT: 33 seconds (ref 24–36)

## 2017-07-16 LAB — PLATELET COUNT: Platelets: 143 10*3/uL — ABNORMAL LOW (ref 150–400)

## 2017-07-16 LAB — HEPARIN LEVEL (UNFRACTIONATED): HEPARIN UNFRACTIONATED: 0.29 [IU]/mL — AB (ref 0.30–0.70)

## 2017-07-16 LAB — POCT I-STAT 4, (NA,K, GLUC, HGB,HCT)
Glucose, Bld: 128 mg/dL — ABNORMAL HIGH (ref 65–99)
HEMATOCRIT: 32 % — AB (ref 39.0–52.0)
Hemoglobin: 10.9 g/dL — ABNORMAL LOW (ref 13.0–17.0)
Potassium: 4 mmol/L (ref 3.5–5.1)
Sodium: 143 mmol/L (ref 135–145)

## 2017-07-16 LAB — TYPE AND SCREEN
ABO/RH(D): O POS
Antibody Screen: NEGATIVE

## 2017-07-16 LAB — HEMOGLOBIN A1C
Hgb A1c MFr Bld: 6.7 % — ABNORMAL HIGH (ref 4.8–5.6)
Mean Plasma Glucose: 145.59 mg/dL

## 2017-07-16 LAB — ABO/RH: ABO/RH(D): O POS

## 2017-07-16 LAB — HEMOGLOBIN AND HEMATOCRIT, BLOOD
HEMATOCRIT: 30.4 % — AB (ref 39.0–52.0)
HEMOGLOBIN: 10.1 g/dL — AB (ref 13.0–17.0)

## 2017-07-16 SURGERY — CORONARY ARTERY BYPASS GRAFTING (CABG)
Anesthesia: General | Site: Chest

## 2017-07-16 MED ORDER — SODIUM CHLORIDE 0.9% FLUSH
3.0000 mL | INTRAVENOUS | Status: DC | PRN
  Administered 2017-07-16: 3 mL via INTRAVENOUS
  Filled 2017-07-16: qty 3

## 2017-07-16 MED ORDER — DEXMEDETOMIDINE HCL IN NACL 400 MCG/100ML IV SOLN
0.0000 ug/kg/h | INTRAVENOUS | Status: DC
  Administered 2017-07-17: 0.3 ug/kg/h via INTRAVENOUS
  Filled 2017-07-16: qty 100

## 2017-07-16 MED ORDER — ALPRAZOLAM 0.25 MG PO TABS: 0.2500 mg | ORAL_TABLET | Freq: Two times a day (BID) | ORAL | Status: DC | PRN

## 2017-07-16 MED ORDER — PROPOFOL 10 MG/ML IV BOLUS
INTRAVENOUS | Status: AC
  Filled 2017-07-16: qty 20

## 2017-07-16 MED ORDER — PHENYLEPHRINE 40 MCG/ML (10ML) SYRINGE FOR IV PUSH (FOR BLOOD PRESSURE SUPPORT)
PREFILLED_SYRINGE | INTRAVENOUS | Status: AC
  Filled 2017-07-16: qty 10

## 2017-07-16 MED ORDER — SODIUM CHLORIDE 0.9 % IV SOLN
INTRAVENOUS | Status: DC
  Filled 2017-07-16: qty 30

## 2017-07-16 MED ORDER — POTASSIUM CHLORIDE 2 MEQ/ML IV SOLN
80.0000 meq | INTRAVENOUS | Status: DC
  Filled 2017-07-16: qty 40

## 2017-07-16 MED ORDER — SODIUM CHLORIDE 0.9 % IV SOLN
INTRAVENOUS | Status: AC
  Administered 2017-07-16: 1.7 [IU]/h via INTRAVENOUS
  Administered 2017-07-16: 1 [IU]/h via INTRAVENOUS
  Filled 2017-07-16: qty 1

## 2017-07-16 MED ORDER — PANTOPRAZOLE SODIUM 40 MG PO TBEC
40.0000 mg | DELAYED_RELEASE_TABLET | Freq: Every day | ORAL | Status: DC
  Administered 2017-07-18 – 2017-07-19 (×2): 40 mg via ORAL
  Filled 2017-07-16 (×2): qty 1

## 2017-07-16 MED ORDER — LACTATED RINGERS IV SOLN
INTRAVENOUS | Status: DC | PRN
  Administered 2017-07-16: 10:00:00 via INTRAVENOUS

## 2017-07-16 MED ORDER — MIDAZOLAM HCL 10 MG/2ML IJ SOLN
INTRAMUSCULAR | Status: AC
  Filled 2017-07-16: qty 2

## 2017-07-16 MED ORDER — HEMOSTATIC AGENTS (NO CHARGE) OPTIME
TOPICAL | Status: DC | PRN
  Administered 2017-07-16: 1 via TOPICAL

## 2017-07-16 MED ORDER — PROPOFOL 10 MG/ML IV BOLUS
INTRAVENOUS | Status: DC | PRN
  Administered 2017-07-16: 30 mg via INTRAVENOUS
  Administered 2017-07-16 (×2): 20 mg via INTRAVENOUS
  Administered 2017-07-16: 30 mg via INTRAVENOUS
  Administered 2017-07-16: 70 mg via INTRAVENOUS

## 2017-07-16 MED ORDER — VANCOMYCIN HCL 10 G IV SOLR
1250.0000 mg | INTRAVENOUS | Status: AC
  Administered 2017-07-16: 1250 mg via INTRAVENOUS
  Filled 2017-07-16: qty 1250

## 2017-07-16 MED ORDER — LACTATED RINGERS IV SOLN: INTRAVENOUS | Status: DC

## 2017-07-16 MED ORDER — TRAMADOL HCL 50 MG PO TABS
50.0000 mg | ORAL_TABLET | ORAL | Status: DC | PRN
  Administered 2017-07-17: 100 mg via ORAL
  Filled 2017-07-16: qty 2

## 2017-07-16 MED ORDER — MORPHINE SULFATE (PF) 4 MG/ML IV SOLN
1.0000 mg | INTRAVENOUS | Status: AC | PRN
  Administered 2017-07-16 (×2): 4 mg via INTRAVENOUS
  Administered 2017-07-17: 2 mg via INTRAVENOUS
  Filled 2017-07-16 (×3): qty 1

## 2017-07-16 MED ORDER — TRANEXAMIC ACID (OHS) PUMP PRIME SOLUTION
2.0000 mg/kg | INTRAVENOUS | Status: DC
  Filled 2017-07-16: qty 1.94

## 2017-07-16 MED ORDER — SODIUM CHLORIDE 0.9 % IV SOLN
0.0000 ug/kg/h | INTRAVENOUS | Status: DC
  Filled 2017-07-16: qty 2

## 2017-07-16 MED ORDER — DEXMEDETOMIDINE HCL IN NACL 400 MCG/100ML IV SOLN
0.1000 ug/kg/h | INTRAVENOUS | Status: AC
  Administered 2017-07-16: 0.7 ug/kg/h via INTRAVENOUS
  Filled 2017-07-16: qty 100

## 2017-07-16 MED ORDER — ARTIFICIAL TEARS OPHTHALMIC OINT
TOPICAL_OINTMENT | OPHTHALMIC | Status: DC | PRN
  Administered 2017-07-16: 1 via OPHTHALMIC

## 2017-07-16 MED ORDER — ACETAMINOPHEN 160 MG/5ML PO SOLN
1000.0000 mg | Freq: Four times a day (QID) | ORAL | Status: DC
Start: 2017-07-17 — End: 2017-07-19
  Administered 2017-07-16 – 2017-07-17 (×2): 1000 mg
  Filled 2017-07-16 (×2): qty 40.6

## 2017-07-16 MED ORDER — HEPARIN SODIUM (PORCINE) 1000 UNIT/ML IJ SOLN
INTRAMUSCULAR | Status: DC | PRN
  Administered 2017-07-16: 34000 [IU] via INTRAVENOUS

## 2017-07-16 MED ORDER — MIDAZOLAM HCL 2 MG/2ML IJ SOLN
INTRAMUSCULAR | Status: AC
  Filled 2017-07-16: qty 2

## 2017-07-16 MED ORDER — DOPAMINE-DEXTROSE 3.2-5 MG/ML-% IV SOLN
0.0000 ug/kg/min | INTRAVENOUS | Status: DC
  Filled 2017-07-16: qty 250

## 2017-07-16 MED ORDER — ALBUMIN HUMAN 5 % IV SOLN
250.0000 mL | INTRAVENOUS | Status: AC | PRN
  Administered 2017-07-16 – 2017-07-17 (×4): 250 mL via INTRAVENOUS
  Filled 2017-07-16 (×2): qty 250

## 2017-07-16 MED ORDER — PHENYLEPHRINE HCL 10 MG/ML IJ SOLN
INTRAMUSCULAR | Status: DC | PRN
  Administered 2017-07-16 (×2): 40 ug via INTRAVENOUS
  Administered 2017-07-16 (×2): 80 ug via INTRAVENOUS
  Administered 2017-07-16 (×2): 40 ug via INTRAVENOUS

## 2017-07-16 MED ORDER — DEXTROSE 5 % IV SOLN
750.0000 mg | INTRAVENOUS | Status: AC
  Administered 2017-07-16: 1.5 g via INTRAVENOUS
  Administered 2017-07-16: .75 g via INTRAVENOUS
  Filled 2017-07-16: qty 750

## 2017-07-16 MED ORDER — DEXTROSE 5 % IV SOLN
1.5000 g | Freq: Two times a day (BID) | INTRAVENOUS | Status: AC
  Administered 2017-07-16 – 2017-07-18 (×4): 1.5 g via INTRAVENOUS
  Filled 2017-07-16 (×5): qty 1.5

## 2017-07-16 MED ORDER — METOPROLOL TARTRATE 12.5 MG HALF TABLET: 12.5000 mg | ORAL_TABLET | Freq: Two times a day (BID) | ORAL | Status: DC

## 2017-07-16 MED ORDER — MIDAZOLAM HCL 2 MG/2ML IJ SOLN
2.0000 mg | INTRAMUSCULAR | Status: DC | PRN
  Administered 2017-07-16 – 2017-07-17 (×2): 2 mg via INTRAVENOUS
  Filled 2017-07-16 (×2): qty 2

## 2017-07-16 MED ORDER — SODIUM CHLORIDE 0.9% FLUSH
3.0000 mL | Freq: Two times a day (BID) | INTRAVENOUS | Status: DC
  Administered 2017-07-17 – 2017-07-19 (×4): 3 mL via INTRAVENOUS

## 2017-07-16 MED ORDER — INSULIN REGULAR BOLUS VIA INFUSION
0.0000 [IU] | Freq: Three times a day (TID) | INTRAVENOUS | Status: DC
  Filled 2017-07-16: qty 10

## 2017-07-16 MED ORDER — CHLORHEXIDINE GLUCONATE 0.12 % MT SOLN
15.0000 mL | OROMUCOSAL | Status: AC
  Administered 2017-07-16: 15 mL via OROMUCOSAL

## 2017-07-16 MED ORDER — PROTAMINE SULFATE 10 MG/ML IV SOLN
INTRAVENOUS | Status: DC | PRN
  Administered 2017-07-16: 300 mg via INTRAVENOUS

## 2017-07-16 MED ORDER — MORPHINE SULFATE (PF) 2 MG/ML IV SOLN: 2.0000 mg | INTRAVENOUS | Status: DC | PRN

## 2017-07-16 MED ORDER — TAMSULOSIN HCL 0.4 MG PO CAPS
0.4000 mg | ORAL_CAPSULE | Freq: Every day | ORAL | Status: DC
  Administered 2017-07-17 – 2017-07-23 (×7): 0.4 mg via ORAL
  Filled 2017-07-16 (×7): qty 1

## 2017-07-16 MED ORDER — TRANEXAMIC ACID (OHS) BOLUS VIA INFUSION
15.0000 mg/kg | INTRAVENOUS | Status: AC
  Administered 2017-07-16: 1456.5 mg via INTRAVENOUS
  Filled 2017-07-16: qty 1457

## 2017-07-16 MED ORDER — SODIUM CHLORIDE 0.45 % IV SOLN
INTRAVENOUS | Status: DC | PRN
  Administered 2017-07-16: 18:00:00 via INTRAVENOUS

## 2017-07-16 MED ORDER — SIMVASTATIN 20 MG PO TABS
20.0000 mg | ORAL_TABLET | Freq: Every day | ORAL | Status: DC
  Administered 2017-07-17 – 2017-07-18 (×2): 20 mg via ORAL
  Filled 2017-07-16 (×2): qty 1

## 2017-07-16 MED ORDER — THROMBIN (RECOMBINANT) 5000 UNITS EX SOLR
CUTANEOUS | Status: DC | PRN
  Administered 2017-07-16 (×3): 5000 [IU] via TOPICAL

## 2017-07-16 MED ORDER — MILRINONE LACTATE IN DEXTROSE 20-5 MG/100ML-% IV SOLN
0.1250 ug/kg/min | INTRAVENOUS | Status: DC
  Filled 2017-07-16: qty 100

## 2017-07-16 MED ORDER — NITROGLYCERIN IN D5W 200-5 MCG/ML-% IV SOLN
2.0000 ug/min | INTRAVENOUS | Status: AC
  Administered 2017-07-16: 5 ug/min via INTRAVENOUS
  Filled 2017-07-16: qty 250

## 2017-07-16 MED ORDER — BISACODYL 5 MG PO TBEC
10.0000 mg | DELAYED_RELEASE_TABLET | Freq: Every day | ORAL | Status: DC
  Administered 2017-07-17 – 2017-07-19 (×3): 10 mg via ORAL
  Filled 2017-07-16 (×3): qty 2

## 2017-07-16 MED ORDER — MORPHINE SULFATE (PF) 2 MG/ML IV SOLN: 1.0000 mg | INTRAVENOUS | Status: DC | PRN

## 2017-07-16 MED ORDER — DEXTROSE 5 % IV SOLN
1.5000 g | INTRAVENOUS | Status: DC
  Filled 2017-07-16: qty 1.5

## 2017-07-16 MED ORDER — ACETAMINOPHEN 160 MG/5ML PO SOLN: 650.0000 mg | Freq: Once | ORAL | Status: AC

## 2017-07-16 MED ORDER — CHLORHEXIDINE GLUCONATE 4 % EX LIQD: 1.0000 "application " | Freq: Once | CUTANEOUS | Status: DC

## 2017-07-16 MED ORDER — ACETAMINOPHEN 500 MG PO TABS
1000.0000 mg | ORAL_TABLET | Freq: Four times a day (QID) | ORAL | Status: DC
  Administered 2017-07-17 – 2017-07-19 (×7): 1000 mg via ORAL
  Filled 2017-07-16 (×7): qty 2

## 2017-07-16 MED ORDER — FENTANYL CITRATE (PF) 250 MCG/5ML IJ SOLN
INTRAMUSCULAR | Status: AC
  Filled 2017-07-16: qty 25

## 2017-07-16 MED ORDER — MORPHINE SULFATE (PF) 4 MG/ML IV SOLN
2.0000 mg | INTRAVENOUS | Status: DC | PRN
  Administered 2017-07-18: 2 mg via INTRAVENOUS
  Filled 2017-07-16: qty 1

## 2017-07-16 MED ORDER — CHLORHEXIDINE GLUCONATE 0.12 % MT SOLN: 15.0000 mL | Freq: Once | OROMUCOSAL | Status: DC

## 2017-07-16 MED ORDER — VANCOMYCIN HCL IN DEXTROSE 1-5 GM/200ML-% IV SOLN
1000.0000 mg | Freq: Once | INTRAVENOUS | Status: AC
  Administered 2017-07-17: 1000 mg via INTRAVENOUS
  Filled 2017-07-16: qty 200

## 2017-07-16 MED ORDER — 0.9 % SODIUM CHLORIDE (POUR BTL) OPTIME
TOPICAL | Status: DC | PRN
  Administered 2017-07-16: 6000 mL

## 2017-07-16 MED ORDER — SODIUM CHLORIDE 0.9 % IV SOLN
30.0000 ug/min | INTRAVENOUS | Status: AC
  Administered 2017-07-16: 50 ug/min via INTRAVENOUS
  Administered 2017-07-16: 25 ug/min via INTRAVENOUS
  Filled 2017-07-16: qty 2

## 2017-07-16 MED ORDER — ACETAMINOPHEN 650 MG RE SUPP
650.0000 mg | Freq: Once | RECTAL | Status: AC
  Administered 2017-07-16: 650 mg via RECTAL

## 2017-07-16 MED ORDER — ONDANSETRON HCL 4 MG/2ML IJ SOLN
4.0000 mg | Freq: Four times a day (QID) | INTRAMUSCULAR | Status: DC | PRN
  Administered 2017-07-17 – 2017-07-18 (×4): 4 mg via INTRAVENOUS
  Filled 2017-07-16 (×4): qty 2

## 2017-07-16 MED ORDER — SODIUM CHLORIDE 0.9 % IJ SOLN
OROMUCOSAL | Status: DC | PRN
  Administered 2017-07-16 (×3): via TOPICAL

## 2017-07-16 MED ORDER — MAGNESIUM SULFATE 50 % IJ SOLN
40.0000 meq | INTRAMUSCULAR | Status: DC
  Filled 2017-07-16: qty 9.85

## 2017-07-16 MED ORDER — OXYCODONE HCL 5 MG PO TABS
5.0000 mg | ORAL_TABLET | ORAL | Status: DC | PRN
  Administered 2017-07-17: 10 mg via ORAL
  Administered 2017-07-18 (×2): 5 mg via ORAL
  Filled 2017-07-16 (×2): qty 1
  Filled 2017-07-16: qty 2

## 2017-07-16 MED ORDER — METOPROLOL TARTRATE 25 MG/10 ML ORAL SUSPENSION: 12.5000 mg | Freq: Two times a day (BID) | ORAL | Status: DC

## 2017-07-16 MED ORDER — FENTANYL CITRATE (PF) 250 MCG/5ML IJ SOLN
INTRAMUSCULAR | Status: DC | PRN
  Administered 2017-07-16: 50 ug via INTRAVENOUS
  Administered 2017-07-16: 100 ug via INTRAVENOUS
  Administered 2017-07-16: 50 ug via INTRAVENOUS
  Administered 2017-07-16: 250 ug via INTRAVENOUS
  Administered 2017-07-16: 75 ug via INTRAVENOUS
  Administered 2017-07-16: 150 ug via INTRAVENOUS
  Administered 2017-07-16: 100 ug via INTRAVENOUS
  Administered 2017-07-16: 200 ug via INTRAVENOUS
  Administered 2017-07-16: 150 ug via INTRAVENOUS
  Administered 2017-07-16: 50 ug via INTRAVENOUS
  Administered 2017-07-16: 75 ug via INTRAVENOUS
  Administered 2017-07-16: 250 ug via INTRAVENOUS

## 2017-07-16 MED ORDER — FAMOTIDINE IN NACL 20-0.9 MG/50ML-% IV SOLN
20.0000 mg | Freq: Two times a day (BID) | INTRAVENOUS | Status: AC
  Administered 2017-07-16: 20 mg via INTRAVENOUS
  Filled 2017-07-16 (×2): qty 50

## 2017-07-16 MED ORDER — SODIUM CHLORIDE 0.9 % IV SOLN: 250.0000 mL | INTRAVENOUS | Status: DC

## 2017-07-16 MED ORDER — NITROGLYCERIN IN D5W 200-5 MCG/ML-% IV SOLN: 0.0000 ug/min | INTRAVENOUS | Status: DC

## 2017-07-16 MED ORDER — SODIUM CHLORIDE 0.9 % IV SOLN
INTRAVENOUS | Status: DC
  Filled 2017-07-16: qty 1

## 2017-07-16 MED ORDER — ROCURONIUM BROMIDE 10 MG/ML (PF) SYRINGE
PREFILLED_SYRINGE | INTRAVENOUS | Status: DC | PRN
Start: 2017-07-16 — End: 2017-07-16
  Administered 2017-07-16: 20 mg via INTRAVENOUS
  Administered 2017-07-16: 80 mg via INTRAVENOUS
  Administered 2017-07-16: 20 mg via INTRAVENOUS
  Administered 2017-07-16 (×2): 50 mg via INTRAVENOUS
  Administered 2017-07-16: 20 mg via INTRAVENOUS
  Administered 2017-07-16: 50 mg via INTRAVENOUS

## 2017-07-16 MED ORDER — FENTANYL CITRATE (PF) 250 MCG/5ML IJ SOLN
INTRAMUSCULAR | Status: AC
  Filled 2017-07-16: qty 5

## 2017-07-16 MED ORDER — METOPROLOL TARTRATE 5 MG/5ML IV SOLN
2.5000 mg | INTRAVENOUS | Status: DC | PRN
  Administered 2017-07-17: 2.5 mg via INTRAVENOUS
  Filled 2017-07-16: qty 5

## 2017-07-16 MED ORDER — SODIUM CHLORIDE 0.9 % IV SOLN
0.0000 ug/min | INTRAVENOUS | Status: DC
  Filled 2017-07-16 (×2): qty 2

## 2017-07-16 MED ORDER — LACTATED RINGERS IV SOLN
INTRAVENOUS | Status: DC | PRN
  Administered 2017-07-16: 11:00:00 via INTRAVENOUS

## 2017-07-16 MED ORDER — MIDAZOLAM HCL 5 MG/5ML IJ SOLN
INTRAMUSCULAR | Status: DC | PRN
  Administered 2017-07-16: 5 mg via INTRAVENOUS
  Administered 2017-07-16 (×2): 2 mg via INTRAVENOUS
  Administered 2017-07-16: 3 mg via INTRAVENOUS

## 2017-07-16 MED ORDER — ALBUMIN HUMAN 5 % IV SOLN
INTRAVENOUS | Status: DC | PRN
  Administered 2017-07-16 (×3): via INTRAVENOUS

## 2017-07-16 MED ORDER — ROCURONIUM BROMIDE 10 MG/ML (PF) SYRINGE
PREFILLED_SYRINGE | INTRAVENOUS | Status: AC
  Filled 2017-07-16: qty 5

## 2017-07-16 MED ORDER — METOPROLOL TARTRATE 12.5 MG HALF TABLET: 12.5000 mg | ORAL_TABLET | Freq: Once | ORAL | Status: DC

## 2017-07-16 MED ORDER — ROCURONIUM BROMIDE 10 MG/ML (PF) SYRINGE
PREFILLED_SYRINGE | INTRAVENOUS | Status: AC
  Filled 2017-07-16: qty 15

## 2017-07-16 MED ORDER — TRANEXAMIC ACID 1000 MG/10ML IV SOLN
1.5000 mg/kg/h | INTRAVENOUS | Status: AC
  Administered 2017-07-16: 1.5 mg/kg/h via INTRAVENOUS
  Filled 2017-07-16: qty 25

## 2017-07-16 MED ORDER — BISACODYL 10 MG RE SUPP: 10.0000 mg | Freq: Every day | RECTAL | Status: DC

## 2017-07-16 MED ORDER — PLASMA-LYTE 148 IV SOLN
INTRAVENOUS | Status: AC
  Administered 2017-07-16: 500 mL
  Filled 2017-07-16: qty 2.5

## 2017-07-16 MED ORDER — EPINEPHRINE PF 1 MG/ML IJ SOLN
0.0000 ug/min | INTRAVENOUS | Status: DC
  Filled 2017-07-16: qty 4

## 2017-07-16 MED ORDER — MAGNESIUM SULFATE 4 GM/100ML IV SOLN
4.0000 g | Freq: Once | INTRAVENOUS | Status: AC
  Administered 2017-07-16: 4 g via INTRAVENOUS
  Filled 2017-07-16: qty 100

## 2017-07-16 MED ORDER — ASPIRIN 81 MG PO CHEW
324.0000 mg | CHEWABLE_TABLET | Freq: Every day | ORAL | Status: DC
  Administered 2017-07-19: 324 mg

## 2017-07-16 MED ORDER — SODIUM CHLORIDE 0.9 % IV SOLN
INTRAVENOUS | Status: DC
  Administered 2017-07-17: 20 mL/h via INTRAVENOUS

## 2017-07-16 MED ORDER — POTASSIUM CHLORIDE 10 MEQ/50ML IV SOLN: 10.0000 meq | INTRAVENOUS | Status: AC

## 2017-07-16 MED ORDER — LACTATED RINGERS IV SOLN
INTRAVENOUS | Status: DC
  Administered 2017-07-17: 21:00:00 via INTRAVENOUS

## 2017-07-16 MED ORDER — ASPIRIN EC 325 MG PO TBEC
325.0000 mg | DELAYED_RELEASE_TABLET | Freq: Every day | ORAL | Status: DC
  Administered 2017-07-17 – 2017-07-18 (×2): 325 mg via ORAL
  Filled 2017-07-16 (×3): qty 1

## 2017-07-16 MED ORDER — LACTATED RINGERS IV SOLN: 500.0000 mL | Freq: Once | INTRAVENOUS | Status: DC | PRN

## 2017-07-16 SURGICAL SUPPLY — 111 items
BAG DECANTER FOR FLEXI CONT (MISCELLANEOUS) ×4 IMPLANT
BANDAGE ACE 4X5 VEL STRL LF (GAUZE/BANDAGES/DRESSINGS) ×4 IMPLANT
BANDAGE ACE 6X5 VEL STRL LF (GAUZE/BANDAGES/DRESSINGS) ×4 IMPLANT
BANDAGE ELASTIC 4 VELCRO ST LF (GAUZE/BANDAGES/DRESSINGS) ×4 IMPLANT
BANDAGE ELASTIC 6 VELCRO ST LF (GAUZE/BANDAGES/DRESSINGS) ×4 IMPLANT
BASKET HEART  (ORDER IN 25'S) (MISCELLANEOUS) ×1
BASKET HEART (ORDER IN 25'S) (MISCELLANEOUS) ×1
BASKET HEART (ORDER IN 25S) (MISCELLANEOUS) ×2 IMPLANT
BLADE NDL 3 SS STRL (BLADE) IMPLANT
BLADE NEEDLE 3 SS STRL (BLADE) ×3 IMPLANT
BLADE NEEDLE 3MM SS STRL (BLADE) ×1
BLADE STERNUM SYSTEM 6 (BLADE) ×4 IMPLANT
BLADE SURG 11 STRL SS (BLADE) ×2 IMPLANT
BNDG GAUZE ELAST 4 BULKY (GAUZE/BANDAGES/DRESSINGS) ×6 IMPLANT
CANISTER SUCT 3000ML PPV (MISCELLANEOUS) ×4 IMPLANT
CATH ROBINSON RED A/P 18FR (CATHETERS) ×8 IMPLANT
CATH THORACIC 28FR (CATHETERS) ×6 IMPLANT
CATH THORACIC 36FR (CATHETERS) ×4 IMPLANT
CATH THORACIC 36FR RT ANG (CATHETERS) ×4 IMPLANT
CLIP VESOCCLUDE MED 24/CT (CLIP) IMPLANT
CLIP VESOCCLUDE SM WIDE 24/CT (CLIP) ×16 IMPLANT
CRADLE DONUT ADULT HEAD (MISCELLANEOUS) ×4 IMPLANT
DRAPE C-ARM 42X72 X-RAY (DRAPES) ×2 IMPLANT
DRAPE CARDIOVASCULAR INCISE (DRAPES) ×4
DRAPE SLUSH/WARMER DISC (DRAPES) ×4 IMPLANT
DRAPE SRG 135X102X78XABS (DRAPES) ×2 IMPLANT
DRSG COVADERM 4X14 (GAUZE/BANDAGES/DRESSINGS) ×4 IMPLANT
ELECT CAUTERY BLADE 6.4 (BLADE) ×4 IMPLANT
ELECT REM PT RETURN 9FT ADLT (ELECTROSURGICAL) ×8
ELECTRODE REM PT RTRN 9FT ADLT (ELECTROSURGICAL) ×4 IMPLANT
FELT TEFLON 1X6 (MISCELLANEOUS) ×8 IMPLANT
GAUZE SPONGE 4X4 12PLY STRL (GAUZE/BANDAGES/DRESSINGS) ×8 IMPLANT
GAUZE SPONGE 4X4 12PLY STRL LF (GAUZE/BANDAGES/DRESSINGS) ×6 IMPLANT
GLOVE BIO SURGEON STRL SZ 6 (GLOVE) ×4 IMPLANT
GLOVE BIO SURGEON STRL SZ 6.5 (GLOVE) IMPLANT
GLOVE BIO SURGEON STRL SZ7 (GLOVE) IMPLANT
GLOVE BIO SURGEON STRL SZ7.5 (GLOVE) IMPLANT
GLOVE BIO SURGEONS STRL SZ 6.5 (GLOVE)
GLOVE BIOGEL PI IND STRL 6 (GLOVE) IMPLANT
GLOVE BIOGEL PI IND STRL 6.5 (GLOVE) IMPLANT
GLOVE BIOGEL PI IND STRL 7.0 (GLOVE) IMPLANT
GLOVE BIOGEL PI INDICATOR 6 (GLOVE) ×10
GLOVE BIOGEL PI INDICATOR 6.5 (GLOVE)
GLOVE BIOGEL PI INDICATOR 7.0 (GLOVE)
GLOVE EUDERMIC 7 POWDERFREE (GLOVE) ×8 IMPLANT
GLOVE ORTHO TXT STRL SZ7.5 (GLOVE) IMPLANT
GOWN STRL REUS W/ TWL LRG LVL3 (GOWN DISPOSABLE) ×8 IMPLANT
GOWN STRL REUS W/ TWL XL LVL3 (GOWN DISPOSABLE) ×2 IMPLANT
GOWN STRL REUS W/TWL LRG LVL3 (GOWN DISPOSABLE) ×24
GOWN STRL REUS W/TWL XL LVL3 (GOWN DISPOSABLE) ×4
HEMOSTAT POWDER SURGIFOAM 1G (HEMOSTASIS) ×12 IMPLANT
HEMOSTAT SURGICEL 2X14 (HEMOSTASIS) ×4 IMPLANT
INSERT FOGARTY 61MM (MISCELLANEOUS) IMPLANT
INSERT FOGARTY XLG (MISCELLANEOUS) ×2 IMPLANT
KIT BASIN OR (CUSTOM PROCEDURE TRAY) ×4 IMPLANT
KIT CATH CPB BARTLE (MISCELLANEOUS) ×4 IMPLANT
KIT ROOM TURNOVER OR (KITS) ×4 IMPLANT
KIT SUCTION CATH 14FR (SUCTIONS) ×4 IMPLANT
KIT VASOVIEW HEMOPRO VH 3000 (KITS) ×4 IMPLANT
NS IRRIG 1000ML POUR BTL (IV SOLUTION) ×22 IMPLANT
PACK E OPEN HEART (SUTURE) ×4 IMPLANT
PACK OPEN HEART (CUSTOM PROCEDURE TRAY) ×4 IMPLANT
PAD ARMBOARD 7.5X6 YLW CONV (MISCELLANEOUS) ×8 IMPLANT
PAD ELECT DEFIB RADIOL ZOLL (MISCELLANEOUS) ×6 IMPLANT
PENCIL BUTTON HOLSTER BLD 10FT (ELECTRODE) ×4 IMPLANT
PUNCH AORTIC ROTATE 4.0MM (MISCELLANEOUS) IMPLANT
PUNCH AORTIC ROTATE 4.5MM 8IN (MISCELLANEOUS) ×4 IMPLANT
PUNCH AORTIC ROTATE 5MM 8IN (MISCELLANEOUS) IMPLANT
SET CARDIOPLEGIA MPS 5001102 (MISCELLANEOUS) ×2 IMPLANT
SOLUTION ANTI FOG 6CC (MISCELLANEOUS) ×2 IMPLANT
SPONGE INTESTINAL PEANUT (DISPOSABLE) IMPLANT
SPONGE LAP 18X18 X RAY DECT (DISPOSABLE) ×6 IMPLANT
SPONGE LAP 4X18 X RAY DECT (DISPOSABLE) ×4 IMPLANT
SUT BONE WAX W31G (SUTURE) ×4 IMPLANT
SUT MNCRL AB 4-0 PS2 18 (SUTURE) ×4 IMPLANT
SUT PROLENE 3 0 SH DA (SUTURE) IMPLANT
SUT PROLENE 3 0 SH1 36 (SUTURE) ×4 IMPLANT
SUT PROLENE 4 0 RB 1 (SUTURE)
SUT PROLENE 4 0 SH DA (SUTURE) IMPLANT
SUT PROLENE 4-0 RB1 .5 CRCL 36 (SUTURE) IMPLANT
SUT PROLENE 5 0 C 1 36 (SUTURE) IMPLANT
SUT PROLENE 6 0 C 1 30 (SUTURE) ×4 IMPLANT
SUT PROLENE 7 0 BV 1 (SUTURE) ×4 IMPLANT
SUT PROLENE 7 0 BV1 MDA (SUTURE) ×6 IMPLANT
SUT PROLENE 8 0 BV175 6 (SUTURE) ×4 IMPLANT
SUT SILK  1 MH (SUTURE) ×4
SUT SILK 1 MH (SUTURE) IMPLANT
SUT SILK 2 0 SH (SUTURE) ×2 IMPLANT
SUT SILK 2 0 SH CR/8 (SUTURE) ×2 IMPLANT
SUT STEEL STERNAL CCS#1 18IN (SUTURE) IMPLANT
SUT STEEL SZ 6 DBL 3X14 BALL (SUTURE) ×6 IMPLANT
SUT VIC AB 1 CTX 36 (SUTURE) ×8
SUT VIC AB 1 CTX36XBRD ANBCTR (SUTURE) ×4 IMPLANT
SUT VIC AB 2-0 CT1 27 (SUTURE) ×8
SUT VIC AB 2-0 CT1 TAPERPNT 27 (SUTURE) IMPLANT
SUT VIC AB 2-0 CTX 27 (SUTURE) IMPLANT
SUT VIC AB 3-0 SH 27 (SUTURE)
SUT VIC AB 3-0 SH 27X BRD (SUTURE) IMPLANT
SUT VIC AB 3-0 X1 27 (SUTURE) IMPLANT
SUT VICRYL 4-0 PS2 18IN ABS (SUTURE) IMPLANT
SYSTEM SAHARA CHEST DRAIN ATS (WOUND CARE) ×6 IMPLANT
TAPE CLOTH SURG 4X10 WHT LF (GAUZE/BANDAGES/DRESSINGS) ×6 IMPLANT
TAPE PAPER 2X10 WHT MICROPORE (GAUZE/BANDAGES/DRESSINGS) ×2 IMPLANT
TOWEL GREEN STERILE (TOWEL DISPOSABLE) ×8 IMPLANT
TOWEL GREEN STERILE FF (TOWEL DISPOSABLE) ×6 IMPLANT
TOWEL OR 17X24 6PK STRL BLUE (TOWEL DISPOSABLE) ×2 IMPLANT
TOWEL OR 17X26 10 PK STRL BLUE (TOWEL DISPOSABLE) ×2 IMPLANT
TRAY FOLEY SILVER 16FR TEMP (SET/KITS/TRAYS/PACK) ×4 IMPLANT
TUBING INSUFFLATION (TUBING) ×4 IMPLANT
UNDERPAD 30X30 (UNDERPADS AND DIAPERS) ×4 IMPLANT
WATER STERILE IRR 1000ML POUR (IV SOLUTION) ×8 IMPLANT

## 2017-07-16 NOTE — Progress Notes (Signed)
  Echocardiogram Echocardiogram Transesophageal has been performed.  Cody Rivers, Cody Rivers 07/16/2017, 1:26 PM

## 2017-07-16 NOTE — Brief Op Note (Signed)
07/14/2017 - 07/16/2017  8:55 AM  PATIENT:  Wallace CullensEdmund Kovalcik  83 y.o. male  PRE-OPERATIVE DIAGNOSIS:  CAD  POST-OPERATIVE DIAGNOSIS:  CAD  PROCEDURE:  Procedure(s): CORONARY ARTERY BYPASS GRAFTING (CABG) TIMES SIX  USING RIGHT AND LEFT  INTERNAL MAMMARY ARTERIES  AND RIGHT AND LEFT  SAPHENOUS VEIN HARVESTED ENDOSCOPICALLY (N/A) TRANSESOPHAGEAL ECHOCARDIOGRAM (TEE) (N/A)  LIMA to LAD RIMA to Diagonal 1 SVG to Intermedius SVG to OM 1 and OM 2 SVG to PDA   SURGEON:  Surgeon(s) and Role:    * Bartle, Payton DoughtyBryan K, MD - Primary  PHYSICIAN ASSISTANT:  Jari Favreessa Shaka Zech, PA-C   ANESTHESIA:   general  EBL:  550 mL   BLOOD ADMINISTERED:none  DRAINS: ROUTINE   LOCAL MEDICATIONS USED:  NONE  SPECIMEN:  No Specimen  DISPOSITION OF SPECIMEN:  N/A  COUNTS:  YES  DICTATION: .Dragon Dictation  PLAN OF CARE: Admit to inpatient   PATIENT DISPOSITION:  ICU - intubated and hemodynamically stable.   Delay start of Pharmacological VTE agent (>24hrs) due to surgical blood loss or risk of bleeding: yes

## 2017-07-16 NOTE — Anesthesia Postprocedure Evaluation (Signed)
Anesthesia Post Note  Patient: Cody Rivers  Procedure(s) Performed: CORONARY ARTERY BYPASS GRAFTING (CABG) TIMES SIX  USING RIGHT AND LEFT  INTERNAL MAMMARY ARTERIES  AND RIGHT AND LEFT  SAPHENOUS VEIN HARVESTED ENDOSCOPICALLY (N/A Chest) TRANSESOPHAGEAL ECHOCARDIOGRAM (TEE) (N/A )     Patient location during evaluation: SICU Anesthesia Type: General Level of consciousness: sedated Pain management: pain level controlled Vital Signs Assessment: post-procedure vital signs reviewed and stable Respiratory status: patient remains intubated per anesthesia plan Cardiovascular status: stable Postop Assessment: no apparent nausea or vomiting Anesthetic complications: no    Last Vitals:  Vitals:   07/16/17 0900 07/16/17 1801  BP: (!) 128/92 (!) 130/58  Pulse: (!) 54 100  Resp: 18 15  Temp:    SpO2: 98% 98%    Last Pain:  Vitals:   07/16/17 0900  TempSrc:   PainSc: 0-No pain                 Kennieth RadFitzgerald, Laneya Gasaway E

## 2017-07-16 NOTE — Consult Note (Signed)
[address]       [place] [address]             [phone]      Cardiothoracic Surgery Consultation  Reason for Consult: Severe left main and 3-vessel CAD Referring Physician: Dr. Idelia Salm Cody Rivers is an 83 y.o. male.  HPI:   The patient is an 83 year old gentleman with hypertension, hyperlipidemia, DM and a history of atrial flutter s/p ablation who was admitted to medicine on 07/14/2017 with new onset chest pain. The ECG was unremarkable and troponin negative. He had a myoview that was positive for ischemia and cathed yesterday late afternoon showing severe left main and 3-vessel CAD with diffusely diseased and calcified vessels, normal EF. He developed hypotension in the cath lab after some sedation and required an IABP and dopamine. He has been pain free and hemodynamically stable in 2H. He is very active at home and thought he was healthy.  Past Medical History:  Diagnosis Date  . Arthritis   . Atrial flutter ([place]) 11/20/2008   Qualifier: Diagnosis of  By: Rayann Heman, MD, Jeneen Rinks    . Chest pain with moderate risk for cardiac etiology 07/14/2017  . Diabetes mellitus without complication ([employer])   . Hyperlipidemia   . Hypertension     Past Surgical History:  Procedure Laterality Date  . ATRIAL FLUTTER ABLATION  2010   Dr Rayann Heman  . REPLACEMENT TOTAL KNEE BILATERAL    . ROTATOR CUFF REPAIR     LEFT    Family History  Problem Relation Age of Onset  . Cancer Father   . Diabetes Maternal Grandfather 90    Social History:  reports that  has never smoked. he has never used smokeless tobacco. He reports that he drinks alcohol. He reports that he does not use drugs.  Allergies:  Allergies  Allergen Reactions  . Robitussin 12 Hour Cough [Dextromethorphan Polistirex Er] Nausea And Vomiting    Medications:  I have reviewed the patient's current medications. Prior to Admission:  Medications Prior to Admission  Medication Sig Dispense Refill Last  Dose  . calcium carbonate (TUMS - DOSED IN MG ELEMENTAL CALCIUM) 500 MG chewable tablet Chew 1 tablet by mouth daily.   07/13/2017 at Unknown time  . docusate sodium (COLACE) 100 MG capsule Take 100 mg by mouth 2 (two) times daily.   07/13/2017 at Unknown time  . furosemide (LASIX) 20 MG tablet Take 20 mg by mouth daily.   07/13/2017 at Unknown time  . glyBURIDE-metformin (GLUCOVANCE) 2.5-500 MG tablet Take 1 tablet by mouth daily with breakfast.   07/13/2017 at Unknown time  . Hypromellose (ARTIFICIAL TEARS OP) Place 1 drop into both eyes daily as needed (dry eyes).   unk at [employer]  . ibuprofen (ADVIL,MOTRIN) 200 MG tablet Take 200 mg by mouth every 6 (six) hours as needed for moderate pain.   unk at unk  . ketoconazole (NIZORAL) 2 % cream Apply 1 application topically daily. To foot rash  0 07/13/2017 at Unknown time  . lisinopril-hydrochlorothiazide (PRINZIDE,ZESTORETIC) 20-12.5 MG tablet Take 1 tablet by mouth daily.   07/13/2017 at Unknown time  . Multiple Vitamin (MULTIVITAMIN) tablet Take 1 tablet by mouth daily.   07/13/2017 at Unknown time  . Multiple Vitamins-Minerals (PRESERVISION AREDS PO) Take 1 tablet by mouth daily.   07/13/2017 at Unknown time  . naproxen sodium (ALEVE) 220 MG tablet Take 220 mg by mouth 2 (two) times daily with a meal.  07/13/2017 at Unknown time  . oxybutynin (DITROPAN) 5 MG tablet Take 5 mg by mouth 3 (three) times daily.   07/13/2017 at Unknown time  . simvastatin (ZOCOR) 20 MG tablet Take 20 mg by mouth daily.   07/13/2017 at Unknown time  . tamsulosin (FLOMAX) 0.4 MG CAPS capsule Take 0.4 mg by mouth daily.   07/13/2017 at Unknown time  . vitamin A 10000 UNIT capsule Take 10,000 Units by mouth daily.   07/13/2017 at Unknown time  . vitamin B-12 (CYANOCOBALAMIN) 1000 MCG tablet Take 1,000 mcg by mouth daily.   07/13/2017 at Unknown time  . amoxicillin-clavulanate (AUGMENTIN) 875-125 MG tablet Take 1 tablet by mouth every 12 (twelve) hours. (Patient not taking: Reported on  07/14/2017) 14 tablet 0 Not Taking at Unknown time  . HYDROcodone-acetaminophen (NORCO/VICODIN) 5-325 MG tablet Take 1-2 tablets by mouth every 4 (four) hours as needed for severe pain. (Patient not taking: Reported on 07/14/2017) 25 tablet 0 Not Taking at Unknown time   Scheduled: . insulin aspart  0-9 Units Subcutaneous TID WC  . multivitamin with minerals  1 tablet Oral Daily  . oxybutynin  5 mg Oral TID  . simvastatin  20 mg Oral Daily  . sodium chloride flush  3 mL Intravenous Q12H  . tamsulosin  0.4 mg Oral Daily  . vitamin B-12  1,000 mcg Oral Daily   Continuous: . sodium chloride    . heparin 950 Units/hr (07/16/17 0300)   DXI:PJASNK chloride, acetaminophen, ALPRAZolam, morphine injection, ondansetron (ZOFRAN) IV, oxyCODONE, polyvinyl alcohol, sodium chloride flush Anti-infectives (From admission, onward)   None      Results for orders placed or performed during the hospital encounter of 07/14/17 (from the past 48 hour(s))  Basic metabolic panel     Status: Abnormal   Collection Time: 07/14/17 11:02 AM  Result Value Ref Range   Sodium 137 135 - 145 mmol/L   Potassium 3.9 3.5 - 5.1 mmol/L   Chloride 105 101 - 111 mmol/L   CO2 22 22 - 32 mmol/L   Glucose, Bld 165 (H) 65 - 99 mg/dL   BUN 20 6 - 20 mg/dL   Creatinine, Ser 1.22 0.61 - 1.24 mg/dL   Calcium 10.1 8.9 - 10.3 mg/dL   GFR calc non Af Amer 53 (L) >60 mL/min   GFR calc Af Amer >60 >60 mL/min    Comment: (NOTE) The eGFR has been calculated using the CKD EPI equation. This calculation has not been validated in all clinical situations. eGFR's persistently <60 mL/min signify possible Chronic Kidney Disease.    Anion gap 10 5 - 15  CBC     Status: None   Collection Time: 07/14/17 11:02 AM  Result Value Ref Range   WBC 9.0 4.0 - 10.5 K/uL   RBC 4.93 4.22 - 5.81 MIL/uL   Hemoglobin 14.9 13.0 - 17.0 g/dL   HCT 42.7 39.0 - 52.0 %   MCV 86.6 78.0 - 100.0 fL   MCH 30.2 26.0 - 34.0 pg   MCHC 34.9 30.0 - 36.0 g/dL    RDW 12.9 11.5 - 15.5 %   Platelets 204 150 - 400 K/uL  I-stat troponin, ED     Status: None   Collection Time: 07/14/17 11:07 AM  Result Value Ref Range   Troponin i, poc 0.01 0.00 - 0.08 ng/mL   Comment 3            Comment: Due to the release kinetics of cTnI, a negative result within  the first hours of the onset of symptoms does not rule out myocardial infarction with certainty. If myocardial infarction is still suspected, repeat the test at appropriate intervals.   Hemoglobin A1c     Status: Abnormal   Collection Time: 07/14/17  5:25 PM  Result Value Ref Range   Hgb A1c MFr Bld 6.7 (H) 4.8 - 5.6 %    Comment: (NOTE) Pre diabetes:          5.7%-6.4% Diabetes:              >6.4% Glycemic control for   <7.0% adults with diabetes    Mean Plasma Glucose 145.59 mg/dL  Troponin I-serum (0, 3, 6 hours)     Status: None   Collection Time: 07/14/17  5:25 PM  Result Value Ref Range   Troponin I <0.03 <0.03 ng/mL  Lipid panel     Status: Abnormal   Collection Time: 07/14/17  5:25 PM  Result Value Ref Range   Cholesterol 126 0 - 200 mg/dL   Triglycerides 188 (H) <150 mg/dL   HDL 36 (L) >40 mg/dL   Total CHOL/HDL Ratio 3.5 RATIO   VLDL 38 0 - 40 mg/dL   LDL Cholesterol 52 0 - 99 mg/dL    Comment:        Total Cholesterol/HDL:CHD Risk Coronary Heart Disease Risk Table                     Men   Women  1/2 Average Risk   3.4   3.3  Average Risk       5.0   4.4  2 X Average Risk   9.6   7.1  3 X Average Risk  23.4   11.0        Use the calculated Patient Ratio above and the CHD Risk Table to determine the patient's CHD Risk.        ATP III CLASSIFICATION (LDL):  <100     mg/dL   Optimal  100-129  mg/dL   Near or Above                    Optimal  130-159  mg/dL   Borderline  160-189  mg/dL   High  >190     mg/dL   Very High   CBG monitoring, ED     Status: Abnormal   Collection Time: 07/14/17  8:06 PM  Result Value Ref Range   Glucose-Capillary 119 (H) 65 - 99 mg/dL    Troponin I-serum (0, 3, 6 hours)     Status: None   Collection Time: 07/14/17  9:40 PM  Result Value Ref Range   Troponin I <0.03 <0.03 ng/mL  Glucose, capillary     Status: Abnormal   Collection Time: 07/14/17 10:26 PM  Result Value Ref Range   Glucose-Capillary 163 (H) 65 - 99 mg/dL   Comment 1 Notify RN    Comment 2 Document in Chart   Troponin I     Status: None   Collection Time: 07/15/17 12:28 AM  Result Value Ref Range   Troponin I <0.03 <0.03 ng/mL  Glucose, capillary     Status: Abnormal   Collection Time: 07/15/17  7:22 AM  Result Value Ref Range   Glucose-Capillary 154 (H) 65 - 99 mg/dL  Glucose, capillary     Status: Abnormal   Collection Time: 07/15/17 10:47 AM  Result Value Ref Range   Glucose-Capillary 219 (H) 65 - 99  mg/dL  Glucose, capillary     Status: Abnormal   Collection Time: 07/15/17 12:51 PM  Result Value Ref Range   Glucose-Capillary 143 (H) 65 - 99 mg/dL  Protime-INR     Status: None   Collection Time: 07/15/17  3:38 PM  Result Value Ref Range   Prothrombin Time 13.1 11.4 - 15.2 seconds   INR 1.00   MRSA PCR Screening     Status: None   Collection Time: 07/15/17  6:22 PM  Result Value Ref Range   MRSA by PCR NEGATIVE NEGATIVE    Comment:        The GeneXpert MRSA Assay (FDA approved for NASAL specimens only), is one component of a comprehensive MRSA colonization surveillance program. It is not intended to diagnose MRSA infection nor to guide or monitor treatment for MRSA infections.   Glucose, capillary     Status: Abnormal   Collection Time: 07/15/17  7:33 PM  Result Value Ref Range   Glucose-Capillary 154 (H) 65 - 99 mg/dL   Comment 1 Capillary Specimen   Basic metabolic panel     Status: Abnormal   Collection Time: 07/15/17  8:06 PM  Result Value Ref Range   Sodium 137 135 - 145 mmol/L   Potassium 3.4 (L) 3.5 - 5.1 mmol/L   Chloride 105 101 - 111 mmol/L   CO2 20 (L) 22 - 32 mmol/L   Glucose, Bld 185 (H) 65 - 99 mg/dL   BUN 23  (H) 6 - 20 mg/dL   Creatinine, Ser 1.31 (H) 0.61 - 1.24 mg/dL   Calcium 9.5 8.9 - 10.3 mg/dL   GFR calc non Af Amer 49 (L) >60 mL/min   GFR calc Af Amer 56 (L) >60 mL/min    Comment: (NOTE) The eGFR has been calculated using the CKD EPI equation. This calculation has not been validated in all clinical situations. eGFR's persistently <60 mL/min signify possible Chronic Kidney Disease.    Anion gap 12 5 - 15  Basic metabolic panel     Status: Abnormal   Collection Time: 07/16/17  3:03 AM  Result Value Ref Range   Sodium 137 135 - 145 mmol/L   Potassium 3.9 3.5 - 5.1 mmol/L   Chloride 106 101 - 111 mmol/L   CO2 22 22 - 32 mmol/L   Glucose, Bld 160 (H) 65 - 99 mg/dL   BUN 21 (H) 6 - 20 mg/dL   Creatinine, Ser 1.30 (H) 0.61 - 1.24 mg/dL   Calcium 9.4 8.9 - 10.3 mg/dL   GFR calc non Af Amer 49 (L) >60 mL/min   GFR calc Af Amer 57 (L) >60 mL/min    Comment: (NOTE) The eGFR has been calculated using the CKD EPI equation. This calculation has not been validated in all clinical situations. eGFR's persistently <60 mL/min signify possible Chronic Kidney Disease.    Anion gap 9 5 - 15  Heparin level (unfractionated)     Status: Abnormal   Collection Time: 07/16/17  3:03 AM  Result Value Ref Range   Heparin Unfractionated 0.29 (L) 0.30 - 0.70 IU/mL    Comment:        IF HEPARIN RESULTS ARE BELOW EXPECTED VALUES, AND PATIENT DOSAGE HAS BEEN CONFIRMED, SUGGEST FOLLOW UP TESTING OF ANTITHROMBIN III LEVELS.   CBC     Status: None   Collection Time: 07/16/17  3:03 AM  Result Value Ref Range   WBC 10.5 4.0 - 10.5 K/uL   RBC 4.52 4.22 - 5.81  MIL/uL   Hemoglobin 13.4 13.0 - 17.0 g/dL   HCT 39.3 39.0 - 52.0 %   MCV 86.9 78.0 - 100.0 fL   MCH 29.6 26.0 - 34.0 pg   MCHC 34.1 30.0 - 36.0 g/dL   RDW 13.1 11.5 - 15.5 %   Platelets 169 150 - 400 K/uL  Glucose, capillary     Status: Abnormal   Collection Time: 07/16/17  8:12 AM  Result Value Ref Range   Glucose-Capillary 172 (H) 65 - 99  mg/dL   Comment 1 Capillary Specimen     Dg Chest 2 View  Result Date: 07/14/2017 CLINICAL DATA:  Chest pain EXAM: CHEST  2 VIEW COMPARISON:  07/25/2012 CT FINDINGS: Heart and mediastinal contours are within normal limits. No focal opacities or effusions. No acute bony abnormality. IMPRESSION: No active cardiopulmonary disease. Electronically Signed   By: Rolm Baptise M.D.   On: 07/14/2017 11:38   Nm Myocar Multi W/spect W/wall Motion / Ef  Result Date: 07/15/2017 CLINICAL DATA:  Chest pain. Diabetes. Hypertension. Elevated lipids. EXAM: MYOCARDIAL IMAGING WITH SPECT (REST AND PHARMACOLOGIC-STRESS) GATED LEFT VENTRICULAR WALL MOTION STUDY LEFT VENTRICULAR EJECTION FRACTION TECHNIQUE: Standard myocardial SPECT imaging was performed after resting intravenous injection of 10 mCi Tc-67mtetrofosmin. Subsequently, intravenous infusion of Lexiscan was performed under the supervision of the Cardiology staff. At peak effect of the drug, 30 mCi Tc-961metrofosmin was injected intravenously and standard myocardial SPECT imaging was performed. Quantitative gated imaging was also performed to evaluate left ventricular wall motion, and estimate left ventricular ejection fraction. COMPARISON:  Plain film of 07/14/2017. FINDINGS: Perfusion: Rest images demonstrate an area of mild severity, medium-size decreased uptake within the mid inferior wall with extension into the inferolateral wall of the base. Stress images demonstrate an area of moderate severity, medium-size reversibility involving the apex. Wall Motion: Normal left ventricular wall motion. No left ventricular dilation. Left Ventricular Ejection Fraction: 54 % End diastolic volume 12262l End systolic volume 56 ml IMPRESSION: 1. Area of reversibility involving the apex is suspicious for inducible ischemia. Fixed defect involving the inferior and inferolateral wall may represent diaphragmatic attenuation, given normal motion in this area. 2. Normal left  ventricular wall motion. 3. Left ventricular ejection fraction 54% 4. Non invasive risk stratification*: Intermediate *2012 Appropriate Use Criteria for Coronary Revascularization Focused Update: J Am Coll Cardiol. 200355;97(4):163-845http://[email or web address]?articleid=[email or web address] These results will be called to the ordering clinician or representative by the Radiologist Assistant, and communication documented in the PACS or zVision Dashboard. Electronically Signed   By: KyAbigail Miyamoto.D.   On: 07/15/2017 14:01    Review of Systems  Constitutional: Negative.   HENT: Negative.   Eyes: Negative.   Respiratory: Negative for shortness of breath.   Cardiovascular: Positive for chest pain. Negative for orthopnea, leg swelling and PND.  Gastrointestinal: Negative.   Genitourinary: Negative.   Musculoskeletal: Positive for joint pain.  Skin: Negative.   Neurological: Negative.   Endo/Heme/Allergies: Negative.   Psychiatric/Behavioral: Negative.    Blood pressure (!) 150/93, pulse (!) 54, temperature 98.6 F (37 C), temperature source Oral, resp. rate 18, height 5' 10" (1.778 m), weight 97.1 kg (214 lb 1 oz), SpO2 99 %. Physical Exam  Constitutional: He is oriented to person, place, and time. He appears well-developed and well-nourished. No distress.  HENT:  Head: Normocephalic and atraumatic.  Eyes: EOM are normal. Pupils are equal, round, and reactive to light.  Neck: Normal range of motion. Neck supple. No JVD present. No thyromegaly present.  Cardiovascular: Normal rate, regular rhythm and normal heart sounds.  No murmur heard. IABP right femoral artery.   Respiratory: Effort normal and breath sounds normal. No respiratory distress. He has no rales.  GI: Soft. Bowel sounds are normal. He exhibits no distension and no mass. There is no tenderness.  Musculoskeletal: Normal range of motion. He exhibits no edema.  Lymphadenopathy:    He has no cervical adenopathy.  Neurological:  He is alert and oriented to person, place, and time.  Skin: Skin is warm and dry.  Psychiatric: He has a normal mood and affect.   Physicians   Panel Physicians Referring Physician Case Authorizing Physician  Sherren Mocha, MD (Primary)    Procedures   LEFT HEART CATH AND CORONARY ANGIOGRAPHY  Conclusion   1. Severe left main and multivessel CAD with severe multilevel LAD/diagonal stenosis, severe diffuse RCA stenosis, and severe LCx/OM stenosis 2. Normal LV systolic function 3. Hypotension, resolved with dopamine and IABP insertion  Plan: TCTS consultation for consideration for consideration of CABG, tx patient to CCU with IABP for support/coronary perfusion. Discussed plan with patient and his son.  Indications   Coronary artery disease involving native coronary artery of native heart with unstable angina pectoris ([place]) [I25.110 (ICD-10-CM)]  Procedural Details/Technique   Technical Details INDICATION: Chest pain at rest concerning for unstable angina. Abnormal nuclear scan with inferior ischemia. Pt is 83 yo and active, no exertional symptoms until his presentation yesterday.   PROCEDURAL DETAILS: The right wrist was prepped, draped, and anesthetized with 1% lidocaine. Using the modified Seldinger technique, a 5/6 French Slender sheath was introduced into the right radial artery. 3 mg of verapamil was administered through the sheath, weight-based unfractionated heparin was administered intravenously. Standard Judkins catheters were used for selective coronary angiography, left ventriculography, and suprarenal abdominal aortic angiography. The patient developed prolonged hypotension during the procedure. He had no chest pain and he remained responsive to questions. Dopamine was required to support his BP up to 15 mcg/kg/min. After the diagnostic radial procedure, the right groin is prepped and draped, anesthetized with 1% lidocaine. Ultrasound guidance is used and a micropuncture  technique is used to access the RFA. A 6 Fr sheath is placed and a pigtail catheter is inserted into the LV. After ventriculography and pressure pullback is done, the sheath is changed out for an IABP sheath. A 50 cc IABP is inserted under fluoroscopic guidance over a 0.025 inch wire and augmented 1:1. Additional heparin is administered. Catheter exchanges were performed over an exchange length guidewire. There were no immediate procedural complications. A TR band was used for radial hemostasis at the completion of the procedure. The patient was transferred to the post catheterization recovery area for further monitoring.    Estimated blood loss <50 mL.  During this procedure the patient was administered the following to achieve and maintain moderate conscious sedation: Versed 2 mg, Fentanyl 25 mcg, while the patient's heart rate, blood pressure, and oxygen saturation were continuously monitored. The period of conscious sedation was 61 minutes, of which I was present face-to-face 100% of this time.  Coronary Findings   Diagnostic  Dominance: Right  Left Main  Dist LM lesion 75% stenosed  Dist LM lesion is 75% stenosed. The lesion is eccentric. The lesion is calcified.  Left Anterior Descending  Ost LAD to Prox LAD lesion 80% stenosed  Ost LAD to Prox LAD lesion is 80% stenosed. The lesion is moderately calcified.  Prox LAD to Mid LAD lesion 95% stenosed  Prox  LAD to Mid LAD lesion is 95% stenosed. The lesion is severely calcified.  Mid LAD lesion 99% stenosed  Mid LAD lesion is 99% stenosed. The lesion is severely calcified.  First Diagonal Branch  Ost 1st Diag lesion 95% stenosed  Ost 1st Diag lesion is 95% stenosed.  Left Circumflex  Ost Cx to Prox Cx lesion 95% stenosed  Ost Cx to Prox Cx lesion is 95% stenosed. The lesion is severely calcified.  First Obtuse Marginal Branch  Ost 1st Mrg lesion 90% stenosed  Ost 1st Mrg lesion is 90% stenosed.  Second Obtuse Marginal Branch  Ost 2nd  Mrg to 2nd Mrg lesion 80% stenosed  Ost 2nd Mrg to 2nd Mrg lesion is 80% stenosed.  Right Coronary Artery  The RCA is severely calcified. The vessel has severe obstruction in the proximal and mid segments. The PDA has severe stenosis in it's proximal portion. The PLA branch is tiny and diffusely diseased.  Prox RCA lesion 90% stenosed  Prox RCA lesion is 90% stenosed. The lesion is severely calcified.  Mid RCA lesion 80% stenosed  Mid RCA lesion is 80% stenosed. The lesion is severely calcified.  Right Posterior Descending Artery  Ost RPDA to RPDA lesion 95% stenosed  Ost RPDA to RPDA lesion is 95% stenosed. The lesion is calcified.  First Right Posterolateral  1st RPLB lesion 75% stenosed  1st RPLB lesion is 75% stenosed.  Intervention   No interventions have been documented.  Wall Motion      All segments of the heart are normal.          Left Heart   Left Ventricle The left ventricular size is normal. The left ventricular systolic function is normal. LV end diastolic pressure is normal. The left ventricular ejection fraction is greater than 65% by visual estimate. No regional wall motion abnormalities.  Aorta Abdominal Aorta: The abdominal aorta is normal in size.  Coronary Diagrams   Diagnostic Diagram       Implants     No implant documentation for this case.  MERGE Images   Show images for CARDIAC CATHETERIZATION   Link to Procedure Log   Procedure Log    Hemo Data    Most Recent Value  AO Systolic Pressure 62 mmHg  AO Diastolic Pressure 36 mmHg  AO Mean 46 mmHg  LV Systolic Pressure 945 mmHg  LV Diastolic Pressure -3 mmHg  LV EDP 15 mmHg  Arterial Occlusion Pressure Extended Systolic Pressure 038 mmHg  Arterial Occlusion Pressure Extended Diastolic Pressure 46 mmHg  Arterial Occlusion Pressure Extended Mean Pressure 67 mmHg  Left Ventricular Apex Extended Systolic Pressure 882 mmHg  Left Ventricular Apex Extended Diastolic Pressure 0 mmHg  Left  Ventricular Apex Extended EDP Pressure 9 mmHg    Assessment/Plan:  He has severe left main and multi-vessel CAD with unstable angina and developed hypotension in the cath lab requiring IABP and pressors. His LV is normal. He has terribly calcified vessels but I think CABG is the only option for him. I discussed the operative procedure with the patient and family including alternatives, benefits and risks; including but not limited to bleeding, blood transfusion, infection, stroke, myocardial infarction, graft failure, heart block requiring a permanent pacemaker, organ dysfunction, and death.  Jennye Moccasin understands and agrees to proceed.  We will schedule surgery for today.  Fernande Boyden Bartle 07/16/2017, 9:07 AM

## 2017-07-16 NOTE — Transfer of Care (Signed)
Immediate Anesthesia Transfer of Care Note  Patient: Cody Rivers  Procedure(s) Performed: CORONARY ARTERY BYPASS GRAFTING (CABG) TIMES SIX  USING RIGHT AND LEFT  INTERNAL MAMMARY ARTERIES  AND RIGHT AND LEFT  SAPHENOUS VEIN HARVESTED ENDOSCOPICALLY (N/A Chest) TRANSESOPHAGEAL ECHOCARDIOGRAM (TEE) (N/A )  Patient Location: SICU  Anesthesia Type:General  Level of Consciousness: Patient remains intubated per anesthesia plan  Airway & Oxygen Therapy: Patient remains intubated per anesthesia plan and Patient placed on Ventilator (see vital sign flow sheet for setting)  Post-op Assessment: Report given to RN and Post -op Vital signs reviewed and stable  Post vital signs: Reviewed and stable  Last Vitals:  Vitals:   07/16/17 0900 07/16/17 1801  BP: (!) 128/92 (!) 130/58  Pulse: (!) 54 100  Resp: 18 15  Temp:    SpO2: 98% 98%    Last Pain:  Vitals:   07/16/17 0900  TempSrc:   PainSc: 0-No pain      Patients Stated Pain Goal: 2 (07/15/17 2153)  Complications: No apparent anesthesia complications

## 2017-07-16 NOTE — Progress Notes (Signed)
ANTICOAGULATION CONSULT NOTE  Pharmacy Consult for Heparin Indication: IABP  Allergies  Allergen Reactions  . Robitussin 12 Hour Cough [Dextromethorphan Polistirex Er] Nausea And Vomiting    Patient Measurements: Height: 5\' 10"  (177.8 cm) Weight: 214 lb 1 oz (97.1 kg) IBW/kg (Calculated) : 73 Heparin Dosing Weight: 93 kg  Vital Signs: Temp: 98.3 F (36.8 C) (12/08 0300) Temp Source: Oral (12/08 0300) BP: 116/43 (12/08 0400) Pulse Rate: 53 (12/08 0400)  Labs: Recent Labs    07/14/17 1102 07/14/17 1725 07/14/17 2140 07/15/17 0028 07/15/17 1538 07/15/17 2006 07/16/17 0303  HGB 14.9  --   --   --   --   --  13.4  HCT 42.7  --   --   --   --   --  39.3  PLT 204  --   --   --   --   --  169  LABPROT  --   --   --   --  13.1  --   --   INR  --   --   --   --  1.00  --   --   HEPARINUNFRC  --   --   --   --   --   --  0.29*  CREATININE 1.22  --   --   --   --  1.31* 1.30*  TROPONINI  --  <0.03 <0.03 <0.03  --   --   --     Estimated Creatinine Clearance: 50.3 mL/min (A) (by C-G formula based on SCr of 1.3 mg/dL (H)).  Assessment: 83 year old male s/p cath, found to have mutlivessel CAD, IABP inserted while awaiting CABG, for heparin  Goal of Therapy:  Heparin level 0.2 to 0.5 units/ml Monitor platelets by anticoagulation protocol: Yes   Plan:  Continue Heparin at current rate  Recheck level later this morning to verify  Geannie RisenGreg Geraldo Haris, PharmD, BCPS

## 2017-07-16 NOTE — OR Nursing (Signed)
1634 first call to sicu, 1708 second call to sicu

## 2017-07-16 NOTE — Progress Notes (Signed)
PROGRESS NOTE    Cody Cullensdmund Winemiller  AVW:[id number] DOB: 10/17/1931 DOA: 07/14/2017 PCP: Joycelyn RuaMeyers, Stephen, MD    Brief Narrative: Cody Rivers is a 83 y.o. male with a history of hypertension, diabetes, urine incontinence hyperlipidemia, diabetes, arthritis, prior history of atrial fibrillation status post ablation about 6 years ago presenting today with acute onset of chest pain since about 9 AM, eventually resolved after 40 minutes.  Reported this pain being as heaviness, squeezing, did not radiate, and did not report any diaphoresis, left arm pain.  She does not take aspirin on a daily basis, but he did take 1 dose of 325 mg with significant relief and no recurrence.  He did not take any nitroglycerin, or pain medicines.  He denies any history of MI.  He never had a catheterization.  He denies any recent long distance trip.  No history of PE or DVT. He denies taking any hormonal supplements, or changes in his medications.  He remains very active.  He denies any recent surgeries.  He denies any new stressors.  He denies any family history of MI. He denies any tobacco, alcohol or recreational drug use.  ED Course:  BP 130/67   Pulse (!) 56   Temp 97.7 F (36.5 C) (Oral)   Resp 16   SpO2 98%   EKG sinus rhythm Ventricular bigeminy Borderline repolarization abnormality Troponin and EKG are negative at this time. Patient is currently chest pain-free X-ray without acute findings     Assessment & Plan:   Principal Problem:   Chest pain with moderate risk for cardiac etiology Active Problems:   Diabetes (HCC)   Hyperlipidemia   Atrial flutter (HCC), s/p ablation 2010   Hypertension   Abnormal nuclear stress test   Coronary artery disease involving native coronary artery of native heart with unstable angina pectoris (HCC)   Chest pain   1-Multi-vessel CAD. Chest pain;  Troponin negative.  Stress test with reversible ischemia. Cardiology consulted. Underwent cath which showed multi  vessel CAD. IABP for support/coronary perfusion.  Plan for CVTS evaluation for CABG.    DM type II;  SSI.  Hold oral medications.   HTN; develops hypotension post procedure. He was transiently on dopamine. And had IABP.  Hyperlipidemia Continue home statins     DVT prophylaxis: heparin Gtt Code Status: full code.  Family Communication; son at bedside.  Disposition Plan: awaiting CVTS evaluation.   Consultants:   Cardiology    Procedures:   Stress test; intermediate risk.    Antimicrobials:   none   Subjective: He is feeling ok, uncomfortable that he can not stand up to go to bathroom.  He denies chest pain or dyspnea.    Objective: Vitals:   07/16/17 0400 07/16/17 0500 07/16/17 0600 07/16/17 0700  BP: (!) 116/43 (!) 143/67 (!) 137/55 (!) 150/93  Pulse: (!) 53 (!) 53 (!) 51 (!) 54  Resp: 15 17 18 18   Temp:      TempSrc:      SpO2: 94% 96% 97% 99%  Weight:      Height:        Intake/Output Summary (Last 24 hours) at 07/16/2017 0820 Last data filed at 07/16/2017 0700 Gross per 24 hour  Intake 1086.25 ml  Output 775 ml  Net 311.25 ml   Filed Weights   07/14/17 2227 07/15/17 0624 07/15/17 1540  Weight: 97.4 kg (214 lb 11.2 oz) 96.8 kg (213 lb 8 oz) 97.1 kg (214 lb 1 oz)    Examination:  General exam: NAD Respiratory system: CTA, Normal respiratory effort.  Cardiovascular system;  1, S 2 RRR Gastrointestinal system: BS present, soft, nt Central nervous system: Alert, non focal.  Extremities: symmetric power.  Skin: No rash    Data Reviewed: I have personally reviewed following labs and imaging studies  CBC: Recent Labs  Lab 07/14/17 1102 07/16/17 0303  WBC 9.0 10.5  HGB 14.9 13.4  HCT 42.7 39.3  MCV 86.6 86.9  PLT 204 169   Basic Metabolic Panel: Recent Labs  Lab 07/14/17 1102 07/15/17 2006 07/16/17 0303  NA 137 137 137  K 3.9 3.4* 3.9  CL 105 105 106  CO2 22 20* 22  GLUCOSE 165* 185* 160*  BUN 20 23* 21*  CREATININE 1.22  1.31* 1.30*  CALCIUM 10.1 9.5 9.4   GFR: Estimated Creatinine Clearance: 50.3 mL/min (A) (by C-G formula based on SCr of 1.3 mg/dL (H)). Liver Function Tests: No results for input(s): AST, ALT, ALKPHOS, BILITOT, PROT, ALBUMIN in the last 168 hours. No results for input(s): LIPASE, AMYLASE in the last 168 hours. No results for input(s): AMMONIA in the last 168 hours. Coagulation Profile: Recent Labs  Lab 07/15/17 1538  INR 1.00   Cardiac Enzymes: Recent Labs  Lab 07/14/17 1725 07/14/17 2140 07/15/17 0028  TROPONINI <0.03 <0.03 <0.03   BNP (last 3 results) No results for input(s): PROBNP in the last 8760 hours. HbA1C: Recent Labs    07/14/17 1725  HGBA1C 6.7*   CBG: Recent Labs  Lab 07/14/17 2226 07/15/17 0722 07/15/17 1047 07/15/17 1251 07/15/17 1933  GLUCAP 163* 154* 219* 143* 154*   Lipid Profile: Recent Labs    07/14/17 1725  CHOL 126  HDL 36*  LDLCALC 52  TRIG 161*  CHOLHDL 3.5   Thyroid Function Tests: No results for input(s): TSH, T4TOTAL, FREET4, T3FREE, THYROIDAB in the last 72 hours. Anemia Panel: No results for input(s): VITAMINB12, FOLATE, FERRITIN, TIBC, IRON, RETICCTPCT in the last 72 hours. Sepsis Labs: No results for input(s): PROCALCITON, LATICACIDVEN in the last 168 hours.  Recent Results (from the past 240 hour(s))  MRSA PCR Screening     Status: None   Collection Time: 07/15/17  6:22 PM  Result Value Ref Range Status   MRSA by PCR NEGATIVE NEGATIVE Final    Comment:        The GeneXpert MRSA Assay (FDA approved for NASAL specimens only), is one component of a comprehensive MRSA colonization surveillance program. It is not intended to diagnose MRSA infection nor to guide or monitor treatment for MRSA infections.          Radiology Studies: Dg Chest 2 View  Result Date: 07/14/2017 CLINICAL DATA:  Chest pain EXAM: CHEST  2 VIEW COMPARISON:  07/25/2012 CT FINDINGS: Heart and mediastinal contours are within normal limits.  No focal opacities or effusions. No acute bony abnormality. IMPRESSION: No active cardiopulmonary disease. Electronically Signed   By: Charlett Nose M.D.   On: 07/14/2017 11:38   Nm Myocar Multi W/spect W/wall Motion / Ef  Result Date: 07/15/2017 CLINICAL DATA:  Chest pain. Diabetes. Hypertension. Elevated lipids. EXAM: MYOCARDIAL IMAGING WITH SPECT (REST AND PHARMACOLOGIC-STRESS) GATED LEFT VENTRICULAR WALL MOTION STUDY LEFT VENTRICULAR EJECTION FRACTION TECHNIQUE: Standard myocardial SPECT imaging was performed after resting intravenous injection of 10 mCi Tc-12m tetrofosmin. Subsequently, intravenous infusion of Lexiscan was performed under the supervision of the Cardiology staff. At peak effect of the drug, 30 mCi Tc-22m tetrofosmin was injected intravenously and standard myocardial SPECT imaging was  performed. Quantitative gated imaging was also performed to evaluate left ventricular wall motion, and estimate left ventricular ejection fraction. COMPARISON:  Plain film of 07/14/2017. FINDINGS: Perfusion: Rest images demonstrate an area of mild severity, medium-size decreased uptake within the mid inferior wall with extension into the inferolateral wall of the base. Stress images demonstrate an area of moderate severity, medium-size reversibility involving the apex. Wall Motion: Normal left ventricular wall motion. No left ventricular dilation. Left Ventricular Ejection Fraction: 54 % End diastolic volume 123 ml End systolic volume 56 ml IMPRESSION: 1. Area of reversibility involving the apex is suspicious for inducible ischemia. Fixed defect involving the inferior and inferolateral wall may represent diaphragmatic attenuation, given normal motion in this area. 2. Normal left ventricular wall motion. 3. Left ventricular ejection fraction 54% 4. Non invasive risk stratification*: Intermediate *2012 Appropriate Use Criteria for Coronary Revascularization Focused Update: J Am Coll Cardiol. 2012;59(9):857-881.  http://content.dementiazones.comonlinejacc.org/article.aspx?articleid=1201161 These results will be called to the ordering clinician or representative by the Radiologist Assistant, and communication documented in the PACS or zVision Dashboard. Electronically Signed   By: Jeronimo GreavesKyle  Talbot M.D.   On: 07/15/2017 14:01        Scheduled Meds: . insulin aspart  0-9 Units Subcutaneous TID WC  . multivitamin with minerals  1 tablet Oral Daily  . oxybutynin  5 mg Oral TID  . simvastatin  20 mg Oral Daily  . sodium chloride flush  3 mL Intravenous Q12H  . tamsulosin  0.4 mg Oral Daily  . vitamin B-12  1,000 mcg Oral Daily   Continuous Infusions: . sodium chloride    . heparin 950 Units/hr (07/16/17 0300)     LOS: 1 day    Time spent: 35 minutes.     Alba CoryBelkys A Aniesa Boback, MD [facility] Pager [phone]  If 7PM-7AM, please contact night-coverage [email or web address] Password [email or web address] 07/16/2017, 8:20 AM

## 2017-07-16 NOTE — Anesthesia Procedure Notes (Signed)
Arterial Line Insertion Start/End12/03/2017 10:57 AM, 07/16/2017 10:57 AM Performed by: Adonis Housekeeperongell, Janna M, CRNA, CRNA  Patient location: OR. Preanesthetic checklist: patient identified, IV checked, site marked, risks and benefits discussed, surgical consent, monitors and equipment checked, pre-op evaluation, timeout performed and anesthesia consent Lidocaine 1% used for infiltration and patient sedated Left, radial was placed Catheter size: 20 G Hand hygiene performed  and maximum sterile barriers used   Attempts: 1 Procedure performed without using ultrasound guided technique. Following insertion, dressing applied and Biopatch. Patient tolerated the procedure well with no immediate complications.

## 2017-07-16 NOTE — Anesthesia Procedure Notes (Signed)
Central Venous Catheter Insertion Performed by: Suzette Battiest, MD, anesthesiologist Start/End12/03/2017 10:45 AM, 07/16/2017 10:55 AM Patient location: Pre-op. Preanesthetic checklist: patient identified, IV checked, site marked, risks and benefits discussed, surgical consent, monitors and equipment checked, pre-op evaluation, timeout performed and anesthesia consent Position: Trendelenburg Lidocaine 1% used for infiltration and patient sedated Hand hygiene performed , maximum sterile barriers used  and Seldinger technique used Catheter size: 9 Fr Total catheter length 10. Central line and PA cath was placed.MAC introducer Swan type:thermodilution PA Cath depth:50 Procedure performed using ultrasound guided technique. Ultrasound Notes:anatomy identified, needle tip was noted to be adjacent to the nerve/plexus identified, no ultrasound evidence of intravascular and/or intraneural injection and image(s) printed for medical record Attempts: 1 Following insertion, line sutured, dressing applied and Biopatch. Post procedure assessment: blood return through all ports, free fluid flow and no air  Patient tolerated the procedure well with no immediate complications.

## 2017-07-16 NOTE — Addendum Note (Signed)
Addendum  created 07/16/17 2003 by Epifanio LeschesMercer, Alaynna Kerwood L, CRNA   Charge Capture section accepted, Intraprocedure Event edited

## 2017-07-16 NOTE — Anesthesia Procedure Notes (Signed)
Central Venous Catheter Insertion Performed by: Marcene DuosFitzgerald, Kennice Finnie, MD, anesthesiologist Start/End12/03/2017 10:45 AM, 07/16/2017 10:55 AM Patient location: Pre-op. Preanesthetic checklist: patient identified, IV checked, site marked, risks and benefits discussed, surgical consent, monitors and equipment checked, pre-op evaluation, timeout performed and anesthesia consent Hand hygiene performed  and maximum sterile barriers used  PA cath was placed.Swan type:thermodilution Procedure performed without using ultrasound guided technique. Attempts: 1 Patient tolerated the procedure well with no immediate complications.

## 2017-07-16 NOTE — Anesthesia Preprocedure Evaluation (Signed)
Anesthesia Evaluation  Patient identified by MRN, date of birth, ID band Patient awake    Reviewed: Allergy & Precautions, NPO status , Patient's Chart, lab work & pertinent test results  Airway Mallampati: II  TM Distance: >3 FB Neck ROM: Full    Dental  (+) Dental Advisory Given   Pulmonary neg pulmonary ROS,    breath sounds clear to auscultation       Cardiovascular hypertension, Pt. on medications + angina + CAD  + dysrhythmias  Rhythm:Regular Rate:Normal  Balloon pump    Neuro/Psych negative neurological ROS     GI/Hepatic negative GI ROS, Neg liver ROS,   Endo/Other  diabetes, Type 2, Oral Hypoglycemic Agents  Renal/GU Renal InsufficiencyRenal disease     Musculoskeletal  (+) Arthritis ,   Abdominal   Peds  Hematology negative hematology ROS (+)   Anesthesia Other Findings   Reproductive/Obstetrics                             Lab Results  Component Value Date   WBC 10.5 07/16/2017   HGB 13.4 07/16/2017   HCT 39.3 07/16/2017   MCV 86.9 07/16/2017   PLT 169 07/16/2017   Lab Results  Component Value Date   CREATININE 1.30 (H) 07/16/2017   BUN 21 (H) 07/16/2017   NA 137 07/16/2017   K 3.9 07/16/2017   CL 106 07/16/2017   CO2 22 07/16/2017   Lab Results  Component Value Date   INR 1.00 07/15/2017   INR 1.1 ratio (H) 11/20/2008   INR 1.4 12/22/2007    Anesthesia Physical Anesthesia Plan  ASA: IV  Anesthesia Plan: General   Post-op Pain Management:    Induction: Intravenous  PONV Risk Score and Plan: 2 and Midazolam, Ondansetron and Treatment may vary due to age or medical condition  Airway Management Planned: Oral ETT  Additional Equipment: Arterial line, CVP, PA Cath, TEE and Ultrasound Guidance Line Placement  Intra-op Plan:   Post-operative Plan: Post-operative intubation/ventilation  Informed Consent: I have reviewed the patients History and  Physical, chart, labs and discussed the procedure including the risks, benefits and alternatives for the proposed anesthesia with the patient or authorized representative who has indicated his/her understanding and acceptance.     Plan Discussed with:   Anesthesia Plan Comments:         Anesthesia Quick Evaluation

## 2017-07-17 ENCOUNTER — Encounter (HOSPITAL_COMMUNITY): Payer: Self-pay | Admitting: Cardiovascular Disease

## 2017-07-17 ENCOUNTER — Inpatient Hospital Stay (HOSPITAL_COMMUNITY): Payer: Medicare Other

## 2017-07-17 LAB — POCT I-STAT, CHEM 8
BUN: 11 mg/dL (ref 6–20)
CHLORIDE: 106 mmol/L (ref 101–111)
CREATININE: 0.9 mg/dL (ref 0.61–1.24)
Calcium, Ion: 1.28 mmol/L (ref 1.15–1.40)
GLUCOSE: 167 mg/dL — AB (ref 65–99)
HEMATOCRIT: 27 % — AB (ref 39.0–52.0)
HEMOGLOBIN: 9.2 g/dL — AB (ref 13.0–17.0)
POTASSIUM: 3.7 mmol/L (ref 3.5–5.1)
Sodium: 142 mmol/L (ref 135–145)
TCO2: 21 mmol/L — ABNORMAL LOW (ref 22–32)

## 2017-07-17 LAB — BASIC METABOLIC PANEL
Anion gap: 4 — ABNORMAL LOW (ref 5–15)
BUN: 14 mg/dL (ref 6–20)
CO2: 22 mmol/L (ref 22–32)
CREATININE: 0.91 mg/dL (ref 0.61–1.24)
Calcium: 8.4 mg/dL — ABNORMAL LOW (ref 8.9–10.3)
Chloride: 112 mmol/L — ABNORMAL HIGH (ref 101–111)
GFR calc Af Amer: >60 mL/min (ref >60)
GLUCOSE: 121 mg/dL — AB (ref 65–99)
Potassium: 4 mmol/L (ref 3.5–5.1)
SODIUM: 138 mmol/L (ref 135–145)

## 2017-07-17 LAB — CBC
HCT: 30 % — ABNORMAL LOW (ref 39.0–52.0)
HEMATOCRIT: 29.1 % — AB (ref 39.0–52.0)
HEMOGLOBIN: 9.7 g/dL — AB (ref 13.0–17.0)
Hemoglobin: 10.2 g/dL — ABNORMAL LOW (ref 13.0–17.0)
MCH: 29.9 pg (ref 26.0–34.0)
MCH: 29.3 pg (ref 26.0–34.0)
MCHC: 34 g/dL (ref 30.0–36.0)
MCHC: 33.3 g/dL (ref 30.0–36.0)
MCV: 88 fL (ref 78.0–100.0)
MCV: 87.9 fL (ref 78.0–100.0)
PLATELETS: 105 10*3/uL — AB (ref 150–400)
Platelets: 108 10*3/uL — ABNORMAL LOW (ref 150–400)
RBC: 3.41 MIL/uL — ABNORMAL LOW (ref 4.22–5.81)
RBC: 3.31 MIL/uL — ABNORMAL LOW (ref 4.22–5.81)
RDW: 13.4 % (ref 11.5–15.5)
RDW: 13.6 % (ref 11.5–15.5)
WBC: 13.5 10*3/uL — ABNORMAL HIGH (ref 4.0–10.5)
WBC: 14 10*3/uL — ABNORMAL HIGH (ref 4.0–10.5)

## 2017-07-17 LAB — GLUCOSE, CAPILLARY
GLUCOSE-CAPILLARY: 113 mg/dL — AB (ref 65–99)
GLUCOSE-CAPILLARY: 114 mg/dL — AB (ref 65–99)
GLUCOSE-CAPILLARY: 150 mg/dL — AB (ref 65–99)
GLUCOSE-CAPILLARY: 172 mg/dL — AB (ref 65–99)
Glucose-Capillary: 105 mg/dL — ABNORMAL HIGH (ref 65–99)
Glucose-Capillary: 108 mg/dL — ABNORMAL HIGH (ref 65–99)
Glucose-Capillary: 113 mg/dL — ABNORMAL HIGH (ref 65–99)
Glucose-Capillary: 119 mg/dL — ABNORMAL HIGH (ref 65–99)
Glucose-Capillary: 120 mg/dL — ABNORMAL HIGH (ref 65–99)
Glucose-Capillary: 121 mg/dL — ABNORMAL HIGH (ref 65–99)
Glucose-Capillary: 133 mg/dL — ABNORMAL HIGH (ref 65–99)
Glucose-Capillary: 137 mg/dL — ABNORMAL HIGH (ref 65–99)
Glucose-Capillary: 138 mg/dL — ABNORMAL HIGH (ref 65–99)
Glucose-Capillary: 150 mg/dL — ABNORMAL HIGH (ref 65–99)
Glucose-Capillary: 166 mg/dL — ABNORMAL HIGH (ref 65–99)
Glucose-Capillary: 177 mg/dL — ABNORMAL HIGH (ref 65–99)

## 2017-07-17 LAB — POCT I-STAT 3, ART BLOOD GAS (G3+)
Acid-base deficit: 6 mmol/L — ABNORMAL HIGH (ref 0.0–2.0)
Bicarbonate: 18.1 mmol/L — ABNORMAL LOW (ref 20.0–28.0)
O2 Saturation: 97 %
PCO2 ART: 30.5 mmHg — AB (ref 32.0–48.0)
PO2 ART: 94 mmHg (ref 83.0–108.0)
TCO2: 19 mmol/L — ABNORMAL LOW (ref 22–32)
pH, Arterial: 7.382 (ref 7.350–7.450)

## 2017-07-17 LAB — MAGNESIUM
MAGNESIUM: 2.3 mg/dL (ref 1.7–2.4)
Magnesium: 2 mg/dL (ref 1.7–2.4)

## 2017-07-17 LAB — CREATININE, SERUM
Creatinine, Ser: 1.01 mg/dL (ref 0.61–1.24)
GFR calc Af Amer: >60 mL/min (ref >60)

## 2017-07-17 LAB — POCT I-STAT 4, (NA,K, GLUC, HGB,HCT)
Glucose, Bld: 124 mg/dL — ABNORMAL HIGH (ref 65–99)
HEMATOCRIT: 27 % — AB (ref 39.0–52.0)
HEMOGLOBIN: 9.2 g/dL — AB (ref 13.0–17.0)
POTASSIUM: 4.1 mmol/L (ref 3.5–5.1)
Sodium: 142 mmol/L (ref 135–145)

## 2017-07-17 MED ORDER — CHLORHEXIDINE GLUCONATE 0.12% ORAL RINSE (MEDLINE KIT)
15.0000 mL | Freq: Two times a day (BID) | OROMUCOSAL | Status: DC
  Administered 2017-07-17: 15 mL via OROMUCOSAL

## 2017-07-17 MED ORDER — INSULIN DETEMIR 100 UNIT/ML ~~LOC~~ SOLN: 15.0000 [IU] | Freq: Every day | SUBCUTANEOUS | Status: DC

## 2017-07-17 MED ORDER — METOCLOPRAMIDE HCL 5 MG/ML IJ SOLN
10.0000 mg | Freq: Four times a day (QID) | INTRAMUSCULAR | Status: DC
  Administered 2017-07-17 – 2017-07-19 (×7): 10 mg via INTRAVENOUS
  Filled 2017-07-17 (×7): qty 2

## 2017-07-17 MED ORDER — ORAL CARE MOUTH RINSE
15.0000 mL | Freq: Two times a day (BID) | OROMUCOSAL | Status: DC
  Administered 2017-07-18 – 2017-07-23 (×7): 15 mL via OROMUCOSAL

## 2017-07-17 MED ORDER — POTASSIUM CHLORIDE 10 MEQ/50ML IV SOLN
10.0000 meq | INTRAVENOUS | Status: AC
  Administered 2017-07-17 (×3): 10 meq via INTRAVENOUS
  Filled 2017-07-17: qty 50

## 2017-07-17 MED ORDER — INSULIN ASPART 100 UNIT/ML ~~LOC~~ SOLN
0.0000 [IU] | SUBCUTANEOUS | Status: DC
  Administered 2017-07-17 (×2): 4 [IU] via SUBCUTANEOUS
  Administered 2017-07-17 (×2): 2 [IU] via SUBCUTANEOUS
  Administered 2017-07-18: 8 [IU] via SUBCUTANEOUS
  Administered 2017-07-18: 4 [IU] via SUBCUTANEOUS
  Administered 2017-07-18 – 2017-07-19 (×4): 2 [IU] via SUBCUTANEOUS
  Administered 2017-07-19: 4 [IU] via SUBCUTANEOUS

## 2017-07-17 MED ORDER — ENOXAPARIN SODIUM 40 MG/0.4ML ~~LOC~~ SOLN
40.0000 mg | Freq: Every day | SUBCUTANEOUS | Status: DC
  Administered 2017-07-17 – 2017-07-22 (×6): 40 mg via SUBCUTANEOUS
  Filled 2017-07-17 (×6): qty 0.4

## 2017-07-17 MED ORDER — ORAL CARE MOUTH RINSE
15.0000 mL | OROMUCOSAL | Status: DC
  Administered 2017-07-17: 15 mL via OROMUCOSAL

## 2017-07-17 MED ORDER — INSULIN DETEMIR 100 UNIT/ML ~~LOC~~ SOLN
15.0000 [IU] | Freq: Every day | SUBCUTANEOUS | Status: DC
  Administered 2017-07-17 – 2017-07-18 (×2): 15 [IU] via SUBCUTANEOUS
  Filled 2017-07-17 (×3): qty 0.15

## 2017-07-17 NOTE — Op Note (Signed)
CARDIOVASCULAR SURGERY OPERATIVE NOTE  07/16/2017  Surgeon:  Alleen BorneBryan K. Shayne Deerman, MD  First Assistant: Jari Favreessa Conte,  PA-C   Preoperative Diagnosis:  Severe left main and multi-vessel coronary artery disease   Postoperative Diagnosis:  Same   Procedure:  1. Median Sternotomy 2. Extracorporeal circulation 3.   Coronary artery bypass grafting x 5   Left internal mammary artery graft to the LAD  Right internal mammary artery graft to the diagonal  SVG to Ramus  Sequential SVG to OM1 and OM2   SVG to PDA   4.   Endoscopic vein harvest from the right and left legs   Anesthesia:  General Endotracheal   Clinical History/Surgical Indication:  The patient is an 83 year old gentleman with hypertension, hyperlipidemia, DM and a history of atrial flutter s/p ablation who was admitted to medicine on 07/14/2017 with new onset chest pain. The ECG was unremarkable and troponin negative. He had a myoview that was positive for ischemia and cathed yesterday late afternoon showing severe left main and 3-vessel CAD with diffusely diseased and calcified vessels, normal EF. He developed hypotension in the cath lab after some sedation and required an IABP and dopamine. He has been pain free and hemodynamically stable in 2H.  He has severe left main and multi-vessel CAD with unstable angina and developed hypotension in the cath lab requiring IABP and pressors. His LV is normal. He has terribly calcified vessels but I think CABG is the only option for him. I discussed the operative procedure with the patient and family including alternatives, benefits and risks; including but not limited to bleeding, blood transfusion, infection, stroke, myocardial infarction, graft failure, heart block requiring a permanent pacemaker, organ dysfunction, and death.  Wallace CullensEdmund Timmons understands and agrees to  proceed.    Preparation:  The patient was seen in the ICU and the correct patient, correct operation were confirmed with the patient after reviewing the medical record and catheterization. The consent was signed by me. The patient was taken back to the operating room and positioned supine on the operating room table. Preoperative antibiotics were given. A pulmonary arterial line and radial arterial line were placed by the anesthesia team.  After being placed under general endotracheal anesthesia by the anesthesia team a foley catheter was placed. The neck, chest, abdomen, and both legs were prepped with betadine soap and solution and draped in the usual sterile manner. A surgical time-out was taken and the correct patient and operative procedure were confirmed with the nursing and anesthesia staff.   Cardiopulmonary Bypass:  A median sternotomy was performed. The pericardium was opened in the midline. Right ventricular function appeared normal. The ascending aorta was of normal size and had no palpable plaque. There were no contraindications to aortic cannulation or cross-clamping. The patient was fully systemically heparinized and the ACT was maintained > 400 sec. The proximal aortic arch was cannulated with a 20 F aortic cannula for arterial inflow. Venous cannulation was performed via the right atrial appendage using a two-staged venous cannula. An antegrade cardioplegia/vent cannula was inserted into the mid-ascending aorta. Aortic occlusion was performed with a single cross-clamp. Systemic cooling to 32 degrees Centigrade and topical cooling of the heart with iced saline were used. Hyperkalemic antegrade cold blood cardioplegia was used to induce diastolic arrest and was then given at about 20 minute intervals throughout the period of arrest to maintain myocardial temperature at or below 10 degrees centigrade. A temperature probe was inserted into the interventricular septum and an insulating  pad was  placed in the pericardium.   Left internal mammary artery  harvest:  The left side of the sternum was retracted using the Rultract retractor. The left internal mammary artery was harvested as a pedicle graft. All side branches were clipped. It was a large-sized vessel of good quality with excellent blood flow. It was ligated distally and divided. It was sprayed with topical papaverine solution to prevent vasospasm.  Right internal mammary artery harvest:  The right side of the sternum was retracted using the Rultract retractor. The right internal mammary artery was harvested as a pedicle graft. All side branches were clipped. It was a large-sized and long vessel of good quality with excellent blood flow. It was ligated distally and divided. It was sprayed with topical papaverine solution to prevent vasospasm.  Endoscopic vein harvest:  The right greater saphenous vein was harvested endoscopically through a 2 cm incision medial to the right knee. It was harvested from the upper thigh to below the knee. It was a medium-sized vein of good quality in the thigh but the rest of it was small and non-distensible. There was only enough for one segment and the therefore the saphenous vein in the left thigh was harvested in the same manner. It was a medium sized vein of good quality in the thigh but at the knee became small. There was only one suitable segment that was long enough for one graft. Therefore I decided to harvest the RIMA. The side branches were all ligated with 4-0 silk ties.    Coronary arteries:  The coronary arteries were examined.   LAD:  Severely and diffusely diseased with calcific plaque. There was only one focal area in the very distal vessel that was soft enough to open and there was still posterior plaque there. The diagonal was diffusely diseased but graftable.  LCX:  Moderate sized Ramus that was visible proximally and then became intramyocardial. It was heavily diseased  proximally but I traced it into the muscle where it was less diseased and graftable. The OM1 and OM2 were diseased proximally but the mid portion of each was graftable.  RCA:  Severely and diffusely diseased with calcific plaque. The PDA was intramyocardial but located distally and was graftable.    Grafts:  1. LIMA to the LAD: 1.75 mm. It was sewn end to side using 8-0 prolene continuous suture. 2. RIMA to the diagonal:  1.6 mm. It was sewn end to side using 8-0 prolene continuous suture. 3. SVG to Ramus:  1.6 mm. It was sewn end to side using 7-0 prolene continuous suture. 4. SVG to PDA:  1.6 mm. It was sewn end to side using 7-0 prolene continuous suture. 5. Sequential SVG to OM1: 1.6 mm. It was sewn in a sequential side to side manner using 7-0 prolene continuous suture. 6.   Sequential SVG to OM2: 1.6 mm. It was sewn in a sequential end to side manner using 7-0 prolene continuous suture.  The proximal vein graft anastomoses were performed to the mid-ascending aorta using continuous 6-0 prolene suture. Graft markers were placed around the proximal anastomoses.   Completion:  The patient was rewarmed to 37 degrees Centigrade. The clamp was removed from the LIMA and RIMA pedicle and there was rapid warming of the septum and return of ventricular fibrillation. The crossclamp was removed with a time of 108 minutes. There was spontaneous return of sinus rhythm. The distal and proximal anastomoses were checked for hemostasis. The position of the grafts was  satisfactory. Two temporary epicardial pacing wires were placed on the right atrium and two on the right ventricle. The patient was weaned from CPB without difficulty on no inotropes. CPB time was 128 minutes. Cardiac output was 5 LPM. TEE showed normal LV function. Heparin was fully reversed with protamine and the aortic and venous cannulas removed. Hemostasis was achieved. Mediastinal and left pleural drainage tubes were placed. The sternum was  closed with double #6 stainless steel wires. The fascia was closed with continuous # 1 vicryl suture. The subcutaneous tissue was closed with 2-0 vicryl continuous suture. The skin was closed with 3-0 vicryl subcuticular suture. All sponge, needle, and instrument counts were reported correct at the end of the case. Dry sterile dressings were placed over the incisions and around the chest tubes which were connected to pleurevac suction. The patient was then transported to the surgical intensive care unit in critical but stable condition.

## 2017-07-17 NOTE — Progress Notes (Signed)
Dr Laneta SimmersBartle contacted r/t Pt non-capture of epicardial pacer. Relayed current hemodynamics and lab results Troubleshooting measures performed discussed as well. Advised to continue to monitor and assess hemodynamics.

## 2017-07-17 NOTE — Procedures (Signed)
Extubation Procedure Note  Patient Details:   Name: Cody Rivers DOB: 02/12/1932 MRN: [id number]   Airway Documentation:     Evaluation  O2 sats: stable throughout Complications: No apparent complications Patient did tolerate procedure well. Bilateral Breath Sounds: Diminished, Clear   Yes   Positive cuff leak noted, NIF - 26, VC 1.49L.  Pt placed on [place] 4 L with humidity, no stridor noted.  Pt requested a face mask instead of the nasal cannula (pending).  Pt able to reach 750 using incentive spirometer.  Family at bedside.  Rayburn FeltJean S Donaldson Richter 07/17/2017, 9:55 AM

## 2017-07-17 NOTE — Progress Notes (Signed)
Pt placed on aerosol mask with humidity per pt request.  Pt tolerating well at this time.

## 2017-07-17 NOTE — Plan of Care (Signed)
Pt maintains adequate hemodynamic output and BP on minimal Neosyn. Epicardial pacing remains tenuous, Dr Bartle aware of difficulty capturing Cody SimmersAAI pacing. Urine output remains 45-60 cc per hr.  After delaying ventilator weaning d/t hemodynamic instability, Pt failerd first attemt d/t inability to raise head off bed [facility].

## 2017-07-17 NOTE — Progress Notes (Signed)
1 Day Post-Op Procedure(s) (LRB): CORONARY ARTERY BYPASS GRAFTING (CABG) TIMES SIX  USING RIGHT AND LEFT  INTERNAL MAMMARY ARTERIES  AND RIGHT AND LEFT  SAPHENOUS VEIN HARVESTED ENDOSCOPICALLY (N/A) TRANSESOPHAGEAL ECHOCARDIOGRAM (TEE) (N/A) Subjective: Still intubated. Failed weaning overnight  Objective: Vital signs in last 24 hours: Temp:  [96.3 F (35.7 C)-99.3 F (37.4 C)] 99.1 F (37.3 C) (12/09 0800) Pulse Rate:  [50-102] 80 (12/09 0800) Cardiac Rhythm: Atrial paced (12/09 0400) Resp:  [9-24] 12 (12/09 0800) BP: (89-139)/(38-95) 132/84 (12/09 0800) SpO2:  [95 %-100 %] 95 % (12/09 0800) Arterial Line BP: (92-156)/(43-70) 156/61 (12/09 0800) FiO2 (%):  [40 %-50 %] 40 % (12/09 0330) Weight:  [97 kg (213 lb 13.5 oz)-102.8 kg (226 lb 10.1 oz)] 102.8 kg (226 lb 10.1 oz) (12/09 0500)  Hemodynamic parameters for last 24 hours: PAP: (20-41)/(9-25) 22/10 CO:  [3.5 L/min-5.3 L/min] 5 L/min CI:  [1.8 L/min/m2-2.5 L/min/m2] 2.3 L/min/m2  Intake/Output from previous day: 12/08 0701 - 12/09 0700 In: 3947.6 [I.V.:2117.6; NG/GT:80; IV Piggyback:1750] Out: 3215 [Urine:2130; Blood:550; Chest Tube:535] Intake/Output this shift: No intake/output data recorded.  General appearance: alert, cooperative and wants breathing tube out Neurologic: intact Heart: regular rate and rhythm, S1, S2 normal, no murmur, click, rub or gallop Lungs: clear to auscultation bilaterally Extremities: edema mild Wound: dressings dry  Lab Results: Recent Labs    07/16/17 1814 07/17/17 0009 07/17/17 0316  WBC 17.3*  --  13.5*  HGB 10.6* 9.2* 10.2*  HCT 31.7* 27.0* 30.0*  PLT 119*  --  105*   BMET:  Recent Labs    07/16/17 0931  07/16/17 1641  07/17/17 0009 07/17/17 0316  NA 136   < > 142   < > 142 138  K 4.1   < > 4.0   < > 4.1 4.0  CL 107   < > 106  --   --  112*  CO2 21*  --   --   --   --  22  GLUCOSE 163*   < > 162*   < > 124* 121*  BUN 19   < > 17  --   --  14  CREATININE 1.27*   < > 0.90   --   --  0.91  CALCIUM 9.6  --   --   --   --  8.4*   < > = values in this interval not displayed.    PT/INR:  Recent Labs    07/16/17 1814  LABPROT 16.1*  INR 1.30   ABG    Component Value Date/Time   PHART 7.313 (L) 07/16/2017 1836   HCO3 20.4 07/16/2017 1836   TCO2 22 07/16/2017 1836   ACIDBASEDEF 6.0 (H) 07/16/2017 1836   O2SAT 98.0 07/16/2017 1836   CBG (last 3)  Recent Labs    07/17/17 0519 07/17/17 0622 07/17/17 0713  GLUCAP 119* 113* 108*   CXR: bibasilar atelectasis  ECG: sinus brady 50's  Assessment/Plan: S/P Procedure(s) (LRB): CORONARY ARTERY BYPASS GRAFTING (CABG) TIMES SIX  USING RIGHT AND LEFT  INTERNAL MAMMARY ARTERIES  AND RIGHT AND LEFT  SAPHENOUS VEIN HARVESTED ENDOSCOPICALLY (N/A) TRANSESOPHAGEAL ECHOCARDIOGRAM (TEE) (N/A)  POD 1 He is hemodynamically stable in sinus brady and being paced. Will hold Lopressor for now. Prior atrial flutter ablation. Plan extubation this am. Mobilize Diuresis Diabetes control: Preop Hgb A1c was 6.7. Start Levemir and SSI and DC drip d/c tubes/lines Continue foley due to diuresing patient, patient in ICU and urinary output monitoring See progression  orders   LOS: 2 days    Alleen BorneBryan K Lucy Woolever 07/17/2017

## 2017-07-17 NOTE — Progress Notes (Signed)
Pt waking slowly off sedation, immediately reaching for chest tubes, ETT. Pt advised to be weary of pulling at tubes and lines without success. Bil. Soft wrist restraints applied per protocol, Daughter @ [facility], states understand of restaint use for safety, MD notified during A.M. Rounds.

## 2017-07-17 NOTE — Progress Notes (Signed)
Patient care will be assume by Dr Laneta SimmersBartle. Triad sign off. Please call us as needed.

## 2017-07-17 NOTE — Progress Notes (Signed)
After Pt's hemodynamics stabilized, attempt made for ventilator wean to extubate. Pt (+) FC with mod. gtrip and foot movement. After weaning on 40 % and a SIMV rate of 4 x 30 mins, Pt unable to hold heard off bed. After an additional 25 mins another attempt failed. Pt noted to be breathing only 4-5 breath about set rate. Vent. Weaning aborted and Pt placed back on rate of 12 bpm, will attempt at a later time.

## 2017-07-18 ENCOUNTER — Inpatient Hospital Stay (HOSPITAL_COMMUNITY): Payer: Medicare Other

## 2017-07-18 ENCOUNTER — Encounter (HOSPITAL_COMMUNITY): Payer: Self-pay | Admitting: Surgery

## 2017-07-18 LAB — GLUCOSE, CAPILLARY
GLUCOSE-CAPILLARY: 124 mg/dL — AB (ref 65–99)
Glucose-Capillary: 130 mg/dL — ABNORMAL HIGH (ref 65–99)
Glucose-Capillary: 182 mg/dL — ABNORMAL HIGH (ref 65–99)
Glucose-Capillary: 152 mg/dL — ABNORMAL HIGH (ref 65–99)
Glucose-Capillary: 235 mg/dL — ABNORMAL HIGH (ref 65–99)

## 2017-07-18 LAB — BASIC METABOLIC PANEL
Anion gap: 7 (ref 5–15)
BUN: 13 mg/dL (ref 6–20)
CO2: 24 mmol/L (ref 22–32)
CREATININE: 1.03 mg/dL (ref 0.61–1.24)
Calcium: 8.9 mg/dL (ref 8.9–10.3)
Chloride: 107 mmol/L (ref 101–111)
Glucose, Bld: 137 mg/dL — ABNORMAL HIGH (ref 65–99)
POTASSIUM: 4.1 mmol/L (ref 3.5–5.1)
SODIUM: 138 mmol/L (ref 135–145)

## 2017-07-18 LAB — CBC
HCT: 29.9 % — ABNORMAL LOW (ref 39.0–52.0)
Hemoglobin: 9.7 g/dL — ABNORMAL LOW (ref 13.0–17.0)
MCH: 29.2 pg (ref 26.0–34.0)
MCHC: 32.4 g/dL (ref 30.0–36.0)
MCV: 90.1 fL (ref 78.0–100.0)
PLATELETS: 110 10*3/uL — AB (ref 150–400)
RBC: 3.32 MIL/uL — ABNORMAL LOW (ref 4.22–5.81)
RDW: 13.9 % (ref 11.5–15.5)
WBC: 14.3 10*3/uL — AB (ref 4.0–10.5)

## 2017-07-18 MED ORDER — FUROSEMIDE 10 MG/ML IJ SOLN
40.0000 mg | Freq: Once | INTRAMUSCULAR | Status: AC
  Administered 2017-07-18: 40 mg via INTRAVENOUS
  Filled 2017-07-18: qty 4

## 2017-07-18 MED ORDER — POTASSIUM CHLORIDE CRYS ER 20 MEQ PO TBCR
20.0000 meq | EXTENDED_RELEASE_TABLET | Freq: Two times a day (BID) | ORAL | Status: AC
  Administered 2017-07-18 (×2): 20 meq via ORAL
  Filled 2017-07-18 (×2): qty 1

## 2017-07-18 MED ORDER — METOPROLOL TARTRATE 12.5 MG HALF TABLET
12.5000 mg | ORAL_TABLET | Freq: Two times a day (BID) | ORAL | Status: DC
  Administered 2017-07-18 – 2017-07-19 (×3): 12.5 mg via ORAL
  Filled 2017-07-18 (×4): qty 1

## 2017-07-18 NOTE — Progress Notes (Signed)
2 Days Post-Op Procedure(s) (LRB): CORONARY ARTERY BYPASS GRAFTING (CABG) TIMES SIX  USING RIGHT AND LEFT  INTERNAL MAMMARY ARTERIES  AND RIGHT AND LEFT  SAPHENOUS VEIN HARVESTED ENDOSCOPICALLY (N/A) TRANSESOPHAGEAL ECHOCARDIOGRAM (TEE) (N/A) Subjective:  Some nausea yesterday improved with Reglan. Better today. Passing some flatus. Ambulated  Objective: Vital signs in last 24 hours: Temp:  [98.9 F (37.2 C)-99.3 F (37.4 C)] 99.3 F (37.4 C) (12/10 0400) Pulse Rate:  [68-129] 80 (12/10 0700) Cardiac Rhythm: Atrial paced (12/10 0400) Resp:  [9-28] 20 (12/10 0700) BP: (88-144)/(50-84) 135/61 (12/10 0700) SpO2:  [88 %-98 %] 94 % (12/10 0700) Arterial Line BP: (110-177)/(38-68) 155/39 (12/09 2000) FiO2 (%):  [28 %-40 %] 35 % (12/09 1622) Weight:  [98.1 kg (216 lb 4.3 oz)] 98.1 kg (216 lb 4.3 oz) (12/10 0600)  Hemodynamic parameters for last 24 hours: PAP: (20-24)/(9-10) 24/10  Intake/Output from previous day: 12/09 0701 - 12/10 0700 In: 963.1 [P.O.:230; I.V.:433.1; IV Piggyback:300] Out: 1015 [Urine:1015] Intake/Output this shift: No intake/output data recorded.  General appearance: alert and cooperative Neurologic: intact Heart: regular rate and rhythm, S1, S2 normal, no murmur, click, rub or gallop Lungs: clear to auscultation bilaterally Abdomen: soft, non-tender; bowel sounds normal; no masses,  no organomegaly Extremities: extremities normal, atraumatic, no cyanosis or edema Wound: dressings dry  Lab Results: Recent Labs    07/17/17 0316 07/17/17 1755 07/17/17 1808  WBC 13.5* 14.0*  --   HGB 10.2* 9.7* 9.2*  HCT 30.0* 29.1* 27.0*  PLT 105* 108*  --    BMET:  Recent Labs    07/17/17 0316  07/17/17 1808 07/18/17 0454  NA 138  --  142 138  K 4.0  --  3.7 4.1  CL 112*  --  106 107  CO2 22  --   --  24  GLUCOSE 121*  --  167* 137*  BUN 14  --  11 13  CREATININE 0.91   < > 0.90 1.03  CALCIUM 8.4*  --   --  8.9   < > = values in this interval not displayed.     PT/INR:  Recent Labs    07/16/17 1814  LABPROT 16.1*  INR 1.30   ABG    Component Value Date/Time   PHART 7.382 07/17/2017 0900   HCO3 18.1 (L) 07/17/2017 0900   TCO2 21 (L) 07/17/2017 1808   ACIDBASEDEF 6.0 (H) 07/17/2017 0900   O2SAT 97.0 07/17/2017 0900   CBG (last 3)  Recent Labs    07/17/17 2108 07/17/17 2340 07/18/17 0447  GLUCAP 166* 177* 124*   CXR: bibasilar atelectasis  Assessment/Plan: S/P Procedure(s) (LRB): CORONARY ARTERY BYPASS GRAFTING (CABG) TIMES SIX  USING RIGHT AND LEFT  INTERNAL MAMMARY ARTERIES  AND RIGHT AND LEFT  SAPHENOUS VEIN HARVESTED ENDOSCOPICALLY (N/A) TRANSESOPHAGEAL ECHOCARDIOGRAM (TEE) (N/A)  Hemodynamically stable in sinus rhythm 70's. Pacer turned off. Will start low dose Lopressor.  DM: glucose under adequate control on levemir and SSI. Will wait until eating well to start oral meds.  Nausea: improved. Continue Reglan today.  Volume excess: mild. Diurese and replace K+  Continue ambulation and IS.   LOS: 3 days    Alleen BorneBryan K Dalton Mille 07/18/2017

## 2017-07-19 LAB — BASIC METABOLIC PANEL
ANION GAP: 9 (ref 5–15)
BUN: 17 mg/dL (ref 6–20)
CALCIUM: 8.9 mg/dL (ref 8.9–10.3)
CO2: 24 mmol/L (ref 22–32)
Chloride: 104 mmol/L (ref 101–111)
Creatinine, Ser: 1.12 mg/dL (ref 0.61–1.24)
GFR calc Af Amer: >60 mL/min (ref >60)
GFR, EST NON AFRICAN AMERICAN: 59 mL/min — AB (ref >60)
GLUCOSE: 133 mg/dL — AB (ref 65–99)
Potassium: 3.7 mmol/L (ref 3.5–5.1)
Sodium: 137 mmol/L (ref 135–145)

## 2017-07-19 LAB — GLUCOSE, CAPILLARY
GLUCOSE-CAPILLARY: 135 mg/dL — AB (ref 65–99)
Glucose-Capillary: 91 mg/dL (ref 65–99)
Glucose-Capillary: 121 mg/dL — ABNORMAL HIGH (ref 65–99)
Glucose-Capillary: 175 mg/dL — ABNORMAL HIGH (ref 65–99)
Glucose-Capillary: 76 mg/dL (ref 65–99)
Glucose-Capillary: 130 mg/dL — ABNORMAL HIGH (ref 65–99)

## 2017-07-19 LAB — CBC
HCT: 28.5 % — ABNORMAL LOW (ref 39.0–52.0)
Hemoglobin: 9.3 g/dL — ABNORMAL LOW (ref 13.0–17.0)
MCH: 29.1 pg (ref 26.0–34.0)
MCHC: 32.6 g/dL (ref 30.0–36.0)
MCV: 89.1 fL (ref 78.0–100.0)
PLATELETS: 125 10*3/uL — AB (ref 150–400)
RBC: 3.2 MIL/uL — ABNORMAL LOW (ref 4.22–5.81)
RDW: 13.4 % (ref 11.5–15.5)
WBC: 12.7 10*3/uL — AB (ref 4.0–10.5)

## 2017-07-19 MED ORDER — SODIUM CHLORIDE 0.9% FLUSH
3.0000 mL | Freq: Two times a day (BID) | INTRAVENOUS | Status: DC
  Administered 2017-07-19 – 2017-07-23 (×7): 3 mL via INTRAVENOUS

## 2017-07-19 MED ORDER — MOVING RIGHT ALONG BOOK
Freq: Once | Status: AC
  Administered 2017-07-19: 20:00:00
  Filled 2017-07-19: qty 1

## 2017-07-19 MED ORDER — ONDANSETRON HCL 4 MG/2ML IJ SOLN: 4.0000 mg | Freq: Four times a day (QID) | INTRAMUSCULAR | Status: DC | PRN

## 2017-07-19 MED ORDER — ONDANSETRON HCL 4 MG PO TABS: 4.0000 mg | ORAL_TABLET | Freq: Four times a day (QID) | ORAL | Status: DC | PRN

## 2017-07-19 MED ORDER — GLYBURIDE 2.5 MG PO TABS
2.5000 mg | ORAL_TABLET | Freq: Every day | ORAL | Status: DC
  Administered 2017-07-19 – 2017-07-23 (×5): 2.5 mg via ORAL
  Filled 2017-07-19 (×5): qty 1

## 2017-07-19 MED ORDER — OXYBUTYNIN CHLORIDE 5 MG PO TABS
5.0000 mg | ORAL_TABLET | Freq: Three times a day (TID) | ORAL | Status: DC
  Administered 2017-07-19 – 2017-07-23 (×13): 5 mg via ORAL
  Filled 2017-07-19 (×14): qty 1

## 2017-07-19 MED ORDER — METFORMIN HCL 500 MG PO TABS
500.0000 mg | ORAL_TABLET | Freq: Every day | ORAL | Status: DC
  Administered 2017-07-19 – 2017-07-21 (×3): 500 mg via ORAL
  Filled 2017-07-19 (×4): qty 1

## 2017-07-19 MED ORDER — METOPROLOL TARTRATE 12.5 MG HALF TABLET
12.5000 mg | ORAL_TABLET | Freq: Two times a day (BID) | ORAL | Status: DC
  Administered 2017-07-19: 12.5 mg via ORAL
  Filled 2017-07-19: qty 1

## 2017-07-19 MED ORDER — SODIUM CHLORIDE 0.9% FLUSH: 3.0000 mL | INTRAVENOUS | Status: DC | PRN

## 2017-07-19 MED ORDER — INSULIN ASPART 100 UNIT/ML ~~LOC~~ SOLN
0.0000 [IU] | Freq: Three times a day (TID) | SUBCUTANEOUS | Status: DC
  Administered 2017-07-19 – 2017-07-21 (×7): 2 [IU] via SUBCUTANEOUS
  Administered 2017-07-21: 4 [IU] via SUBCUTANEOUS
  Administered 2017-07-22: 2 [IU] via SUBCUTANEOUS
  Administered 2017-07-22: 4 [IU] via SUBCUTANEOUS
  Administered 2017-07-22: 2 [IU] via SUBCUTANEOUS
  Administered 2017-07-23 (×2): 4 [IU] via SUBCUTANEOUS

## 2017-07-19 MED ORDER — POTASSIUM CHLORIDE CRYS ER 20 MEQ PO TBCR
20.0000 meq | EXTENDED_RELEASE_TABLET | Freq: Two times a day (BID) | ORAL | Status: DC
  Administered 2017-07-19: 20 meq via ORAL
  Filled 2017-07-19: qty 1

## 2017-07-19 MED ORDER — ATORVASTATIN CALCIUM 40 MG PO TABS
40.0000 mg | ORAL_TABLET | Freq: Every day | ORAL | Status: DC
  Administered 2017-07-19 – 2017-07-22 (×4): 40 mg via ORAL
  Filled 2017-07-19 (×4): qty 1

## 2017-07-19 MED ORDER — BISACODYL 10 MG RE SUPP
10.0000 mg | Freq: Every day | RECTAL | Status: DC | PRN
  Filled 2017-07-19: qty 1

## 2017-07-19 MED ORDER — PANTOPRAZOLE SODIUM 40 MG PO TBEC
40.0000 mg | DELAYED_RELEASE_TABLET | Freq: Every day | ORAL | Status: DC
  Administered 2017-07-20 – 2017-07-23 (×4): 40 mg via ORAL
  Filled 2017-07-19 (×4): qty 1

## 2017-07-19 MED ORDER — ASPIRIN EC 325 MG PO TBEC
325.0000 mg | DELAYED_RELEASE_TABLET | Freq: Every day | ORAL | Status: DC
  Administered 2017-07-20 – 2017-07-23 (×4): 325 mg via ORAL
  Filled 2017-07-19 (×4): qty 1

## 2017-07-19 MED ORDER — ACETAMINOPHEN 325 MG PO TABS: 650.0000 mg | ORAL_TABLET | Freq: Four times a day (QID) | ORAL | Status: DC | PRN

## 2017-07-19 MED ORDER — OXYCODONE HCL 5 MG PO TABS: 5.0000 mg | ORAL_TABLET | ORAL | Status: DC | PRN

## 2017-07-19 MED ORDER — BISACODYL 5 MG PO TBEC: 10.0000 mg | DELAYED_RELEASE_TABLET | Freq: Every day | ORAL | Status: DC | PRN

## 2017-07-19 MED ORDER — SODIUM CHLORIDE 0.9 % IV SOLN: 250.0000 mL | INTRAVENOUS | Status: DC | PRN

## 2017-07-19 MED ORDER — TRAMADOL HCL 50 MG PO TABS: 50.0000 mg | ORAL_TABLET | Freq: Four times a day (QID) | ORAL | Status: DC | PRN

## 2017-07-19 NOTE — Progress Notes (Signed)
3 Days Post-Op Procedure(s) (LRB): CORONARY ARTERY BYPASS GRAFTING (CABG) TIMES SIX  USING RIGHT AND LEFT  INTERNAL MAMMARY ARTERIES  AND RIGHT AND LEFT  SAPHENOUS VEIN HARVESTED ENDOSCOPICALLY (N/A) TRANSESOPHAGEAL ECHOCARDIOGRAM (TEE) (N/A) Subjective: Only complaint is of urinary incontinence which has been a chronic problem for him. Ditropan resumed  Objective: Vital signs in last 24 hours: Temp:  [98.2 F (36.8 C)-99.2 F (37.3 C)] 98.2 F (36.8 C) (12/11 0700) Pulse Rate:  [58-84] 78 (12/11 0700) Cardiac Rhythm: Normal sinus rhythm (12/11 0400) Resp:  [13-24] 17 (12/10 1800) BP: (95-161)/(44-96) 138/70 (12/11 0700) SpO2:  [92 %-100 %] 92 % (12/11 0700) Weight:  [101.5 kg (223 lb 12.3 oz)] 101.5 kg (223 lb 12.3 oz) (12/11 0500)  Hemodynamic parameters for last 24 hours:    Intake/Output from previous day: 12/10 0701 - 12/11 0700 In: 1245 [P.O.:1165; I.V.:30; IV Piggyback:50] Out: 2160 [Urine:2160] Intake/Output this shift: No intake/output data recorded.  General appearance: alert and cooperative Neurologic: intact Heart: regular rate and rhythm, S1, S2 normal, no murmur, click, rub or gallop Lungs: clear to auscultation bilaterally Extremities: edema mild Wound: incisions ok  Lab Results: Recent Labs    07/18/17 0454 07/19/17 0616  WBC 14.3* 12.7*  HGB 9.7* 9.3*  HCT 29.9* 28.5*  PLT 110* 125*   BMET:  Recent Labs    07/18/17 0454 07/19/17 0616  NA 138 137  K 4.1 3.7  CL 107 104  CO2 24 24  GLUCOSE 137* 133*  BUN 13 17  CREATININE 1.03 1.12  CALCIUM 8.9 8.9    PT/INR:  Recent Labs    07/16/17 1814  LABPROT 16.1*  INR 1.30   ABG    Component Value Date/Time   PHART 7.382 07/17/2017 0900   HCO3 18.1 (L) 07/17/2017 0900   TCO2 21 (L) 07/17/2017 1808   ACIDBASEDEF 6.0 (H) 07/17/2017 0900   O2SAT 97.0 07/17/2017 0900   CBG (last 3)  Recent Labs    07/18/17 2008 07/19/17 0400 07/19/17 0748  GLUCAP 235* 91 121*     Assessment/Plan: S/P Procedure(s) (LRB): CORONARY ARTERY BYPASS GRAFTING (CABG) TIMES SIX  USING RIGHT AND LEFT  INTERNAL MAMMARY ARTERIES  AND RIGHT AND LEFT  SAPHENOUS VEIN HARVESTED ENDOSCOPICALLY (N/A) TRANSESOPHAGEAL ECHOCARDIOGRAM (TEE) (N/A)  POD 3 Hemodynamically stable in sinus rhythm Mild volume excess: will diurese further. Weight about 10 lbs over preop. DM: glucose under adequate control. He is eating well. Will resume his oral meds. Transfer to 4E and continue mobilization, IS.   LOS: 4 days    Cody BorneBryan K Jaileigh Rivers 07/19/2017

## 2017-07-19 NOTE — Progress Notes (Signed)
Stopped in to visit with patient.  Daughter at bedside caring for her father.  Patient says he is improving and looking forward to going home.     07/19/17 1055  Clinical Encounter Type  Visited With Patient and family together  Visit Type Initial;Spiritual support

## 2017-07-19 NOTE — Progress Notes (Signed)
Progress Note  Patient Name: Cody Rivers Date of Encounter: 07/19/2017  Primary Cardiologist: Dr. Excell Seltzerooper.    Subjective   Feels OK.  No acute pain or SOB.   Inpatient Medications    Scheduled Meds: . acetaminophen  1,000 mg Oral Q6H   Or  . acetaminophen (TYLENOL) oral liquid 160 mg/5 mL  1,000 mg Per Tube Q6H  . aspirin EC  325 mg Oral Daily   Or  . aspirin  324 mg Per Tube Daily  . bisacodyl  10 mg Oral Daily   Or  . bisacodyl  10 mg Rectal Daily  . enoxaparin (LOVENOX) injection  40 mg Subcutaneous QHS  . insulin aspart  0-24 Units Subcutaneous Q4H  . insulin detemir  15 Units Subcutaneous Daily  . mouth rinse  15 mL Mouth Rinse BID  . metoCLOPramide (REGLAN) injection  10 mg Intravenous Q6H  . metoprolol tartrate  12.5 mg Oral BID  . pantoprazole  40 mg Oral Daily  . simvastatin  20 mg Oral Daily  . sodium chloride flush  3 mL Intravenous Q12H  . tamsulosin  0.4 mg Oral Daily   Continuous Infusions: . sodium chloride    . lactated ringers 20 mL/hr at 07/17/17 2105  . lactated ringers 10 mL/hr at 07/18/17 0900   PRN Meds: ALPRAZolam, metoprolol tartrate, morphine injection, ondansetron (ZOFRAN) IV, oxyCODONE, sodium chloride flush, traMADol   Vital Signs    Vitals:   07/19/17 0401 07/19/17 0500 07/19/17 0600 07/19/17 0700  BP:  135/68  138/70  Pulse:  80 82 78  Resp:      Temp: 98.2 F (36.8 C)   98.2 F (36.8 C)  TempSrc: Oral   Oral  SpO2:  94% 97% 92%  Weight:  223 lb 12.3 oz (101.5 kg)    Height:        Intake/Output Summary (Last 24 hours) at 07/19/2017 0757 Last data filed at 07/19/2017 0500 Gross per 24 hour  Intake 1245 ml  Output 2160 ml  Net -915 ml   Filed Weights   07/17/17 0500 07/18/17 0600 07/19/17 0500  Weight: 226 lb 10.1 oz (102.8 kg) 216 lb 4.3 oz (98.1 kg) 223 lb 12.3 oz (101.5 kg)    Telemetry    NSR - Personally Reviewed  ECG    NA - Personally Reviewed  Physical Exam   GEN: No acute distress.   Neck: No   JVD Cardiac: RRR, no murmurs, rubs, or gallops.  Respiratory: Clear  to auscultation bilaterally. GI: Soft, nontender, non-distended  MS: Mild edema; No deformity. Neuro:  Nonfocal  Psych: Normal affect   Labs    Chemistry Recent Labs  Lab 07/16/17 0931  07/17/17 0316 07/17/17 1755 07/17/17 1808 07/18/17 0454 07/19/17 0616  NA 136   < > 138  --  142 138 137  K 4.1   < > 4.0  --  3.7 4.1 3.7  CL 107   < > 112*  --  106 107 104  CO2 21*  --  22  --   --  24 24  GLUCOSE 163*   < > 121*  --  167* 137* 133*  BUN 19   < > 14  --  11 13 17   CREATININE 1.27*   < > 0.91 1.01 0.90 1.03 1.12  CALCIUM 9.6  --  8.4*  --   --  8.9 8.9  PROT 6.4*  --   --   --   --   --   --  ALBUMIN 3.6  --   --   --   --   --   --   AST 22  --   --   --   --   --   --   ALT 18  --   --   --   --   --   --   ALKPHOS 57  --   --   --   --   --   --   BILITOT 0.6  --   --   --   --   --   --   GFRNONAA 50*  --  >60 >60  --  >60 59*  GFRAA 59*  --  >60 >60  --  >60 >60  ANIONGAP 8  --  4*  --   --  7 9   < > = values in this interval not displayed.     Hematology Recent Labs  Lab 07/17/17 1755 07/17/17 1808 07/18/17 0454 07/19/17 0616  WBC 14.0*  --  14.3* 12.7*  RBC 3.31*  --  3.32* 3.20*  HGB 9.7* 9.2* 9.7* 9.3*  HCT 29.1* 27.0* 29.9* 28.5*  MCV 87.9  --  90.1 89.1  MCH 29.3  --  29.2 29.1  MCHC 33.3  --  32.4 32.6  RDW 13.6  --  13.9 13.4  PLT 108*  --  110* 125*    Cardiac Enzymes Recent Labs  Lab 07/14/17 1725 07/14/17 2140 07/15/17 0028  TROPONINI <0.03 <0.03 <0.03    Recent Labs  Lab 07/14/17 1107  TROPIPOC 0.01    Lab Results  Component Value Date   CHOL 126 07/14/2017   TRIG 188 (H) 07/14/2017   HDL 36 (L) 07/14/2017   LDLCALC 52 07/14/2017    BNPNo results for input(s): BNP, PROBNP in the last 168 hours.   DDimer No results for input(s): DDIMER in the last 168 hours.   Radiology    Dg Chest Port 1 View  Result Date: 07/18/2017 CLINICAL DATA:  Post CABG.  EXAM: PORTABLE CHEST 1 VIEW COMPARISON:  07/17/2017 FINDINGS: Chest tubes have been removed. Pulmonary artery catheter has been removed. Right jugular central line is still present. Endotracheal tube and nasogastric tube have been removed. Low lung volumes with basilar densities. Heart size is stable. Atherosclerotic calcifications at the aortic arch. Negative for a pneumothorax. IMPRESSION: Low lung volumes with basilar chest densities. Findings could represent atelectasis and/or small effusions. Electronically Signed   By: Richarda Overlie M.D.   On: 07/18/2017 08:06    Cardiac Studies   TEE:  07/16/17  Result status:    Aortic valve: The valve is trileaflet. Mild valve thickening present. No stenosis. No regurgitation.  Mitral valve: Mild leaflet thickening is present.  Right ventricle: Normal cavity size, wall thickness and ejection fraction.  Tricuspid valve: Trace regurgitation.  Pulmonic valve: Trace regurgitation.       Patient Profile     83 y.o. male with hypertension, hyperlipidemia, DM and a history of atrial flutter s/p ablation who was admitted to medicine on 07/14/2017 with new onset chest pain. The ECG was unremarkable and troponin negative. He had a myoview that was positive for ischemia and cath showing severe left main and 3-vessel CAD with diffusely diseased and calcified vessels, normal EF.  He is now status post CABG.     Assessment & Plan    CAD/CABG:  Started beta blocker yesterday.  Progressing.  Needs to be encouraged to participate with  rehab.    DYSLIPIDEMIA:  Will change to higher dose statin per guidelines.    For questions or updates, please contact [facility] Please consult [email or web address] for contact info under Cardiology/STEMI.   Signed, Rollene RotundaJames Armando Lauman, MD  07/19/2017, 7:57 AM

## 2017-07-19 NOTE — Progress Notes (Signed)
Assessed pt for treatment.  Re-instructed in use of IS, good effort and technique.  Instructed/provided Acapella/PEP device for additional lung recruitment.  Pt tolerated well, good effort and technique.  Encouraged IS and Acapella Q1H w/a.  SpO2 98% room air.  Strong, dry, NPC with pillow splinting.

## 2017-07-19 NOTE — Plan of Care (Signed)
Pt progressing as expected, but refused ambulation this am after repeated encouragement. Offered pain medication prior to, but pt stated he was simply tired. Will encourage day shift to ambulate once pt up for the day. Will continue to educate the importance of ambulation and activity.

## 2017-07-20 LAB — GLUCOSE, CAPILLARY
GLUCOSE-CAPILLARY: 117 mg/dL — AB (ref 65–99)
GLUCOSE-CAPILLARY: 156 mg/dL — AB (ref 65–99)
GLUCOSE-CAPILLARY: 130 mg/dL — AB (ref 65–99)
Glucose-Capillary: 124 mg/dL — ABNORMAL HIGH (ref 65–99)

## 2017-07-20 LAB — MAGNESIUM: Magnesium: 1.8 mg/dL (ref 1.7–2.4)

## 2017-07-20 MED ORDER — METOPROLOL TARTRATE 25 MG PO TABS
25.0000 mg | ORAL_TABLET | Freq: Two times a day (BID) | ORAL | Status: DC
  Administered 2017-07-20 – 2017-07-21 (×3): 25 mg via ORAL
  Filled 2017-07-20 (×3): qty 1

## 2017-07-20 MED ORDER — LISINOPRIL 5 MG PO TABS
5.0000 mg | ORAL_TABLET | Freq: Every day | ORAL | Status: DC
  Administered 2017-07-20: 5 mg via ORAL
  Filled 2017-07-20: qty 1

## 2017-07-20 MED ORDER — FUROSEMIDE 40 MG PO TABS
40.0000 mg | ORAL_TABLET | Freq: Every day | ORAL | Status: AC
  Administered 2017-07-20 – 2017-07-22 (×3): 40 mg via ORAL
  Filled 2017-07-20 (×3): qty 1

## 2017-07-20 MED ORDER — MAGNESIUM OXIDE 400 (241.3 MG) MG PO TABS
400.0000 mg | ORAL_TABLET | Freq: Two times a day (BID) | ORAL | Status: DC
  Administered 2017-07-20 – 2017-07-23 (×6): 400 mg via ORAL
  Filled 2017-07-20 (×7): qty 1

## 2017-07-20 MED ORDER — POTASSIUM CHLORIDE CRYS ER 20 MEQ PO TBCR
40.0000 meq | EXTENDED_RELEASE_TABLET | Freq: Every day | ORAL | Status: AC
  Administered 2017-07-20 – 2017-07-22 (×3): 40 meq via ORAL
  Filled 2017-07-20 (×3): qty 2

## 2017-07-20 NOTE — Care Management Important Message (Signed)
Important Message  Patient Details  Name: Cody Rivers MRN: [id number] Date of Birth: 01/26/1932   Medicare Important Message Given:  Yes    [name] 07/20/2017, 1:40 PM

## 2017-07-20 NOTE — Progress Notes (Addendum)
[address]       [place] [id number]             [phone]      4 Days Post-Op Procedure(s) (LRB): CORONARY ARTERY BYPASS GRAFTING (CABG) TIMES SIX  USING RIGHT AND LEFT  INTERNAL MAMMARY ARTERIES  AND RIGHT AND LEFT  SAPHENOUS VEIN HARVESTED ENDOSCOPICALLY (N/A) TRANSESOPHAGEAL ECHOCARDIOGRAM (TEE) (N/A) Subjective: Feels pretty well, no specific c/o  Objective: Vital signs in last 24 hours: Temp:  [97.8 F (36.6 C)-99.3 F (37.4 C)] 99 F (37.2 C) (12/12 0428) Pulse Rate:  [76-96] 76 (12/12 0428) Cardiac Rhythm: Normal sinus rhythm (12/11 2229) Resp:  [11-23] 11 (12/12 0428) BP: (125-161)/(66-85) 161/85 (12/12 0428) SpO2:  [95 %-98 %] 96 % (12/12 0428) Weight:  [222 lb 10.6 oz (101 kg)] 222 lb 10.6 oz (101 kg) (12/12 0428)  Hemodynamic parameters for last 24 hours:    Intake/Output from previous day: 12/11 0701 - 12/12 0700 In: 600 [P.O.:600] Out: 520 [Urine:520] Intake/Output this shift: No intake/output data recorded.  General appearance: alert, cooperative and no distress Heart: regular rate and rhythm and occas extrasystoles Lungs: clear to auscultation bilaterally Abdomen: benign Extremities: + minor LE edema Wound: incis healing well  Lab Results: Recent Labs    07/18/17 0454 07/19/17 0616  WBC 14.3* 12.7*  HGB 9.7* 9.3*  HCT 29.9* 28.5*  PLT 110* 125*   BMET:  Recent Labs    07/18/17 0454 07/19/17 0616  NA 138 137  K 4.1 3.7  CL 107 104  CO2 24 24  GLUCOSE 137* 133*  BUN 13 17  CREATININE 1.03 1.12  CALCIUM 8.9 8.9    PT/INR: No results for input(s): LABPROT, INR in the last 72 hours. ABG    Component Value Date/Time   PHART 7.382 07/17/2017 0900   HCO3 18.1 (L) 07/17/2017 0900   TCO2 21 (L) 07/17/2017 1808   ACIDBASEDEF 6.0 (H) 07/17/2017 0900   O2SAT 97.0 07/17/2017 0900   CBG (last 3)  Recent Labs    07/19/17 1551 07/19/17 2135 07/20/17 0613  GLUCAP 76 130* 117*    Meds Scheduled Meds: .  aspirin EC  325 mg Oral Daily  . atorvastatin  40 mg Oral q1800  . enoxaparin (LOVENOX) injection  40 mg Subcutaneous QHS  . glyBURIDE  2.5 mg Oral Q breakfast  . insulin aspart  0-24 Units Subcutaneous TID AC & HS  . mouth rinse  15 mL Mouth Rinse BID  . metFORMIN  500 mg Oral Q breakfast  . metoprolol tartrate  12.5 mg Oral BID  . oxybutynin  5 mg Oral TID  . pantoprazole  40 mg Oral QAC breakfast  . potassium chloride  20 mEq Oral BID  . sodium chloride flush  3 mL Intravenous Q12H  . tamsulosin  0.4 mg Oral Daily   Continuous Infusions: . sodium chloride     PRN Meds:.sodium chloride, acetaminophen, bisacodyl **OR** bisacodyl, ondansetron **OR** ondansetron (ZOFRAN) IV, oxyCODONE, sodium chloride flush, traMADol  Xrays No results found.  Assessment/Plan: S/P Procedure(s) (LRB): CORONARY ARTERY BYPASS GRAFTING (CABG) TIMES SIX  USING RIGHT AND LEFT  INTERNAL MAMMARY ARTERIES  AND RIGHT AND LEFT  SAPHENOUS VEIN HARVESTED ENDOSCOPICALLY (N/A) TRANSESOPHAGEAL ECHOCARDIOGRAM (TEE) (N/A)  1 doing well 2 sinus with freq PVC's, K+ a little low- will replace, also appears to need some diuretic, will order, recheck magnesium. Normal LV fxn on cath- BP should tol increase in lopressor dose 3 good UO-  has condom cath- cont tamsulosin 4 sugars adeq controlled on metformin/glyburide, monitor for hypoglycemia 5 d/c wires 6 poss d/c 1-2 days, cont pulm toilet and rehab 7 leukocytosis improving, H/H pretty stable 8 add low dose ACE-I  LOS: 5 days    Cody Rivers 07/20/2017   Chart reviewed, patient examined, agree with above. He is doing well overall. His only complaint is of urinary incontinence. His weight is still probably 10 lbs over preop and he has LE edema. Would continue lasix for another week. No BM yet.

## 2017-07-20 NOTE — Progress Notes (Signed)
07/20/2017 1530 EPW D/C'd per order and per protocol.  Ends intact. Pt. Tolerated well.  Advised bedrest x1hr.  Call bell in reach.  Vital signs collected per protocol. Kathryne HitchAllen, Coltan Spinello C

## 2017-07-20 NOTE — Discharge Summary (Signed)
Physician Discharge Summary  Patient ID: Cody Rivers MRN: [id number] DOB/AGE: 81-Nov-1933 81 y.o.  Admit date: 07/14/2017 Discharge date: 07/23/2017  Admission Diagnoses: Chest pain  Discharge Diagnoses:  Principal Problem:   Chest pain with moderate risk for cardiac etiology Active Problems:   Diabetes (HCC)   Hyperlipidemia   Atrial flutter (HCC), s/p ablation 2010   Hypertension   Abnormal nuclear stress test   Coronary artery disease involving native coronary artery of native heart with unstable angina pectoris (HCC)   Chest pain   S/P CABG x 6  Patient Active Problem List   Diagnosis Date Noted  . 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  . Abnormal nuclear stress test 07/15/2017  . Chest pain 07/15/2017  . Coronary artery disease involving native coronary artery of native heart with unstable angina pectoris (HCC)   . Hypertension 07/14/2017  . Chest pain with moderate risk for cardiac etiology 07/14/2017  . Diabetes (HCC) 12/03/2008  . Hyperlipidemia 12/03/2008  . ALLERGIC RHINITIS 12/03/2008  . BENIGN PROSTATIC HYPERTROPHY, WITH URINARY OBSTRUCTION 12/03/2008  . DEGENERATIVE JOINT DISEASE 12/03/2008  . HYPERTENSION, BENIGN 11/20/2008  . Atrial flutter [facility]), s/p ablation 2010 11/20/2008   HPI: at time of consultation  The patient is an 83 year old gentleman with hypertension, hyperlipidemia, DM and a history of atrial flutter s/p ablation who was admitted to medicine on 07/14/2017 with new onset chest pain. The ECG was unremarkable and troponin negative. He had a myoview that was positive for ischemia and cathed yesterday late afternoon showing severe left main and 3-vessel CAD with diffusely diseased and calcified vessels, normal EF. He developed hypotension in the cath lab after some sedation and required an IABP and dopamine. He has been pain free and hemodynamically stable in 2H. He is very active at home and thought he was healthy.  Discharged Condition: good  Hospital Course:  The patient presented to the emergency department with the primary symptom of chest pain.  A Myoview scan was obtained and this was positive for ischemia and he was admitted for further management with cardiology consultation.  He underwent cardiac catheterization on 07/15/2017 and findings were notable for severe three-vessel coronary artery disease.  Cardiothoracic surgical consultation was obtained with Evelene Croon MD who evaluated the patient and studies and agree with recommendations to proceed with urgent CABG.  On 07/16/2017 he was taken the operating room below described procedure.  He tolerated it well and was taken to the surgical intensive care unit in stable condition.  Post operative hospital course:  The patient has overall progressed nicely.  He did fail his initial ventilatory weaning attempt due to some hemodynamic instability and sedation.  He also initially had sinus bradycardia and required atrial pacing.  He does have a history of a previous atrial flutter ablation.  He was able to be extubated on postoperative day #1.  All routine lines, monitors and drainage devices have been discontinued in the standard fashion following usual protocols.  He did have some early postoperative nausea but this resolved fairly quickly with Reglan.  He did have some postoperative volume overload but is responding well to diuretics..  He was able to be weaned from the pacemaker and is maintaining sinus rhythm with PVC's.  Diabetes has been under adequate control initially using Glucomander protocols with transition to his oral preoperative medications.  His pre op HGA1C was 6.7.He has been started on a statin.  He has been started on an ACE inhibitor.  He did have some urinary  retention but this is improved with resumption of his Ditropan.  On postop day 5 the patient went into atrial fibrillation with RVR and some PVCs.  We replaced electrolytes appropriately.  We started amiodarone IV.  We continued diuresis  for fluid overload.  TSH was checked and found to be 7 and free T4 was 1.06 (free T3 result is pending as of 07/23/2017). Patient was instructed to follow up with his medical doctor after discharge. Cardiology was following along and assisting with medication.  Postop day 6, he converted to normal sinus rhythm with PVCs.  We transitioned his amiodarone to p.o.  We continued low-dose metoprolol for blood pressure and heart rate control.  Incisions are noted to be healing well.  He is tolerating gradually increasing activities using standard protocols.  He does have a postoperative acute blood loss anemia which is stable.  His last H and H was 9.3 and 28.5. He also had mild thrombocytopenia. His last platelet count was up to 125,000. Epicardial pacing wires have been removed. Chest tube sutures will remain and be removed in the office after discharge. Because his heart rate is in the 60's to low 70's, oral Amiodarone has been decreased to 200 mg bid and Lopressor to 25 mg bid. The patient was on Lasix 20 mg daily prior to admission. Per Dr. Donata ClayVan Trigt, he is to continue this for one week after discharge then stop.As discussed with Dr. Donata ClayVan Trigt, he is felt surgically stable for discharge today.   Consults: cardiology  Significant Diagnostic Studies:  LEFT HEART CATH AND CORONARY ANGIOGRAPHY by Dr. Excell Seltzerooper on 07/15/2017:  Conclusion   1. Severe left main and multivessel CAD with severe multilevel LAD/diagonal stenosis, severe diffuse RCA stenosis, and severe LCx/OM stenosis 2. Normal LV systolic function 3. Hypotension, resolved with dopamine and IABP insertion  Plan: TCTS consultation for consideration for consideration of CABG, tx patient to CCU with IABP for support/coronary perfusion. Discussed plan with patient and his son.     Treatments: surgery:  CARDIOVASCULAR SURGERY OPERATIVE NOTE  07/16/2017  Surgeon:  Alleen BorneBryan K. Bartle, MD  First Assistant: Jari Favreessa Conte,  PA-C   Preoperative  Diagnosis:  Severe left main and multi-vessel coronary artery disease   Postoperative Diagnosis:  Same   Procedure:  1. Median Sternotomy 2. Extracorporeal circulation 3.   Coronary artery bypass grafting x 5   Left internal mammary artery graft to the LAD  Right internal mammary artery graft to the diagonal  SVG to Ramus  Sequential SVG to OM1 and OM2       SVG to PDA   4.   Endoscopic vein harvest from the right and left legs by Dr. Laneta SimmersBartle on 07/16/2017.   Discharge Exam: Blood pressure 115/62, pulse 75, temperature 98.2 F (36.8 C), temperature source Oral, resp. rate (!) 24, height 5\' 10"  (1.778 m), weight 214 lb 15.2 oz (97.5 kg), SpO2 95 %.   Cardiovascular: RRR Pulmonary: Clear to auscultation bilaterally Abdomen: Soft, obese,non tender, bowel sounds present. Extremities: Trace bilateral lower extremity edema. Wounds: Clean and dry.  No erythema or signs of infection.  Disposition: 01-Home or Self Care  Discharge Instructions    Amb Referral to Cardiac Rehabilitation   Complete by:  As directed    Diagnosis:  CABG   CABG X ___:  5     Allergies as of 07/23/2017      Reactions   Robitussin 12 Hour Cough [dextromethorphan Polistirex Er] Nausea And Vomiting  Medication List    STOP taking these medications   amoxicillin-clavulanate 875-125 MG tablet Commonly known as:  AUGMENTIN   HYDROcodone-acetaminophen 5-325 MG tablet Commonly known as:  NORCO/VICODIN   ibuprofen 200 MG tablet Commonly known as:  ADVIL,MOTRIN   lisinopril-hydrochlorothiazide 20-12.5 MG tablet Commonly known as:  PRINZIDE,ZESTORETIC   naproxen sodium 220 MG tablet Commonly known as:  ALEVE   simvastatin 20 MG tablet Commonly known as:  ZOCOR     TAKE these medications   acetaminophen 325 MG tablet Commonly known as:  TYLENOL Take 2 tablets (650 mg total) by mouth every 6 (six) hours as needed for mild pain.   amiodarone 200 MG tablet Commonly known as:   PACERONE Take 1 tablet (200 mg total) by mouth 2 (two) times daily. For 5 days;then take Amiodarone 200 mg by mouth daily thereafter   ARTIFICIAL TEARS OP Place 1 drop into both eyes daily as needed (dry eyes).   aspirin 325 MG EC tablet Take 1 tablet (325 mg total) by mouth daily. Start taking on:  07/24/2017   atorvastatin 40 MG tablet Commonly known as:  LIPITOR Take 1 tablet (40 mg total) by mouth daily at 6 PM.   calcium carbonate 500 MG chewable tablet Commonly known as:  TUMS - dosed in mg elemental calcium Chew 1 tablet by mouth daily.   docusate sodium 100 MG capsule Commonly known as:  COLACE Take 100 mg by mouth 2 (two) times daily.   furosemide 20 MG tablet Commonly known as:  LASIX Take 1 tablet (20 mg total) by mouth daily. For one week then stop. What changed:  additional instructions   glyBURIDE-metformin 2.5-500 MG tablet Commonly known as:  GLUCOVANCE Take 1 tablet by mouth daily with breakfast.   ketoconazole 2 % cream Commonly known as:  NIZORAL Apply 1 application topically daily. To foot rash   metoprolol tartrate 25 MG tablet Commonly known as:  LOPRESSOR Take 1 tablet (25 mg total) by mouth 2 (two) times daily.   multivitamin tablet Take 1 tablet by mouth daily.   oxybutynin 5 MG tablet Commonly known as:  DITROPAN Take 5 mg by mouth 3 (three) times daily.   potassium chloride 10 MEQ tablet Commonly known as:  K-DUR Take 1 tablet (10 mEq total) by mouth daily. For one week then stop.   PRESERVISION AREDS PO Take 1 tablet by mouth daily.   tamsulosin 0.4 MG Caps capsule Commonly known as:  FLOMAX Take 0.4 mg by mouth daily.   traMADol 50 MG tablet Commonly known as:  ULTRAM Take 1 tablet (50 mg total) by mouth every 6 (six) hours as needed for severe pain.   vitamin A 16109 UNIT capsule Take 10,000 Units by mouth daily.   vitamin B-12 1000 MCG tablet Commonly known as:  CYANOCOBALAMIN Take 1,000 mcg by mouth daily.       Follow-up Information    Alleen Borne, MD. Go on 08/17/2017.   Specialty:  Cardiothoracic Surgery Why:  PA/LAT CXR to be taken (at [facility] which is in the same building as Dr. Sharee Pimple office on 08/17/2017 at 2:00 pm;Appointment time is at 2:30 pm Contact information: [address] [employer] [place] [address] [phone]        Manson Passey, [place]. Go on 08/10/2017.   Specialty:  Cardiology Why:  @3pm  for post hospital follow up.  Contact information: [address] [place] [address] [phone]  Joycelyn RuaMeyers, Stephen, MD. Schedule an appointment as soon as possible for a visit today.   Specialty:  Family Medicine Why:  Call for a follow up appointment 1-2 weeks regarding TSH 7 Contact information: [address] [place] [address] [phone]        Nurse. Go on 07/27/2017.   Why:  Appointment time is at 11:00 am Contact information: [address] [place] [address]        The patient has been discharged on:   1.Beta Blocker:  Yes [  x ]                              No   [   ]                              If No, reason:  2.Ace Inhibitor/ARB: Yes [   ]                                     No  [  x  ]                                     If No, reason: hypotension  3.Statin:   Yes [ x  ]                  No  [   ]                  If No, reason:  4.Ecasa:  Yes  [ x  ]                  No   [   ]                  If No, reason:    Signed: Elenore RotaDonielle M Zimmerman PA-C 07/23/2017, 12:35 PM

## 2017-07-20 NOTE — Progress Notes (Signed)
CARDIAC REHAB PHASE I   PRE:  Rate/Rhythm: 80 SR with PVCs    BP: sitting 144/83    SaO2: 96 RA  MODE:  Ambulation: 430 ft   POST:  Rate/Rhythm: 100 ST    BP: sitting 152/76     SaO2: 98 RA  Pt needed assist to get to EOB. Stood with min assist. Used RW with slow, steady pace. No c/o. To recliner after walk, daughter present. Encouraged x2 more walks and IS. He seems slightly slow in his thinking.  [id number]   Harriet MassonRandi Kristan Rheya Minogue CES, ACSM 07/20/2017 11:36 AM

## 2017-07-21 LAB — BASIC METABOLIC PANEL
Anion gap: 10 (ref 5–15)
BUN: 19 mg/dL (ref 6–20)
CALCIUM: 9.1 mg/dL (ref 8.9–10.3)
CO2: 24 mmol/L (ref 22–32)
CREATININE: 1.02 mg/dL (ref 0.61–1.24)
Chloride: 104 mmol/L (ref 101–111)
GFR calc Af Amer: >60 mL/min (ref >60)
GFR calc non Af Amer: >60 mL/min (ref >60)
GLUCOSE: 167 mg/dL — AB (ref 65–99)
Potassium: 3.8 mmol/L (ref 3.5–5.1)
Sodium: 138 mmol/L (ref 135–145)

## 2017-07-21 LAB — GLUCOSE, CAPILLARY
GLUCOSE-CAPILLARY: 174 mg/dL — AB (ref 65–99)
Glucose-Capillary: 162 mg/dL — ABNORMAL HIGH (ref 65–99)
Glucose-Capillary: 197 mg/dL — ABNORMAL HIGH (ref 65–99)
Glucose-Capillary: 149 mg/dL — ABNORMAL HIGH (ref 65–99)

## 2017-07-21 MED ORDER — DEXTROSE 5 % IV SOLN: 150.0000 mg | Freq: Once | INTRAVENOUS | Status: DC

## 2017-07-21 MED ORDER — LISINOPRIL 10 MG PO TABS
10.0000 mg | ORAL_TABLET | Freq: Every day | ORAL | Status: DC
  Administered 2017-07-21: 10 mg via ORAL
  Filled 2017-07-21 (×2): qty 1

## 2017-07-21 MED ORDER — AMIODARONE LOAD VIA INFUSION
150.0000 mg | Freq: Once | INTRAVENOUS | Status: DC
  Filled 2017-07-21: qty 83.34

## 2017-07-21 MED ORDER — AMIODARONE HCL IN DEXTROSE 360-4.14 MG/200ML-% IV SOLN
INTRAVENOUS | Status: AC
  Administered 2017-07-21: 150 mg via INTRAVENOUS
  Filled 2017-07-21: qty 200

## 2017-07-21 MED ORDER — AMIODARONE IV BOLUS ONLY 150 MG/100ML: 150.0000 mg | Freq: Once | INTRAVENOUS | Status: DC

## 2017-07-21 MED ORDER — AMIODARONE HCL IN DEXTROSE 360-4.14 MG/200ML-% IV SOLN
60.0000 mg/h | INTRAVENOUS | Status: AC
  Administered 2017-07-21 (×2): 60 mg/h via INTRAVENOUS
  Filled 2017-07-21: qty 200

## 2017-07-21 MED ORDER — AMIODARONE HCL IN DEXTROSE 360-4.14 MG/200ML-% IV SOLN
30.0000 mg/h | INTRAVENOUS | Status: DC
  Administered 2017-07-21: 59.94 mg/h via INTRAVENOUS
  Administered 2017-07-21 (×3): 30 mg/h via INTRAVENOUS
  Filled 2017-07-21 (×2): qty 200

## 2017-07-21 MED ORDER — METOPROLOL TARTRATE 25 MG PO TABS
37.5000 mg | ORAL_TABLET | Freq: Two times a day (BID) | ORAL | Status: DC
  Administered 2017-07-21 – 2017-07-23 (×4): 37.5 mg via ORAL
  Filled 2017-07-21 (×4): qty 1

## 2017-07-21 MED ORDER — AMIODARONE LOAD VIA INFUSION
150.0000 mg | Freq: Once | INTRAVENOUS | Status: AC
  Administered 2017-07-21 (×2): 150 mg via INTRAVENOUS
  Filled 2017-07-21: qty 83.34

## 2017-07-21 NOTE — Progress Notes (Addendum)
[address]       [place] [id number]             [phone]      5 Days Post-Op Procedure(s) (LRB): CORONARY ARTERY BYPASS GRAFTING (CABG) TIMES SIX  USING RIGHT AND LEFT  INTERNAL MAMMARY ARTERIES  AND RIGHT AND LEFT  SAPHENOUS VEIN HARVESTED ENDOSCOPICALLY (N/A) TRANSESOPHAGEAL ECHOCARDIOGRAM (TEE) (N/A) Subjective: conts to do well, but did go into afib- now on amiodarone  Objective: Vital signs in last 24 hours: Temp:  [98.2 F (36.8 C)-99.3 F (37.4 C)] 98.4 F (36.9 C) (12/13 0400) Pulse Rate:  [52-79] 58 (12/13 0400) Cardiac Rhythm: Atrial fibrillation (12/13 0131) Resp:  [12-20] 20 (12/13 0400) BP: (135-150)/(72-85) 135/75 (12/13 0400) SpO2:  [93 %-98 %] 95 % (12/13 0400) Weight:  [216 lb 14.9 oz (98.4 kg)] 216 lb 14.9 oz (98.4 kg) (12/13 0400)  Hemodynamic parameters for last 24 hours:    Intake/Output from previous day: 12/12 0701 - 12/13 0700 In: 999.3 [P.O.:760; I.V.:239.3] Out: 1030 [Urine:1030] Intake/Output this shift: No intake/output data recorded.  General appearance: alert, cooperative and no distress Heart: irregularly irregular rhythm Lungs: clear to auscultation bilaterally Abdomen: benign Extremities: + edema, left>right lower ext Wound: incis healing well  Lab Results: Recent Labs    07/19/17 0616  WBC 12.7*  HGB 9.3*  HCT 28.5*  PLT 125*   BMET:  Recent Labs    07/19/17 0616 07/21/17 0401  NA 137 138  K 3.7 3.8  CL 104 104  CO2 24 24  GLUCOSE 133* 167*  BUN 17 19  CREATININE 1.12 1.02  CALCIUM 8.9 9.1    PT/INR: No results for input(s): LABPROT, INR in the last 72 hours. ABG    Component Value Date/Time   PHART 7.382 07/17/2017 0900   HCO3 18.1 (L) 07/17/2017 0900   TCO2 21 (L) 07/17/2017 1808   ACIDBASEDEF 6.0 (H) 07/17/2017 0900   O2SAT 97.0 07/17/2017 0900   CBG (last 3)  Recent Labs    07/20/17 1640 07/20/17 2110 07/21/17 0623  GLUCAP 130* 124* 162*    Meds Scheduled Meds: .  aspirin EC  325 mg Oral Daily  . atorvastatin  40 mg Oral q1800  . enoxaparin (LOVENOX) injection  40 mg Subcutaneous QHS  . furosemide  40 mg Oral Daily  . glyBURIDE  2.5 mg Oral Q breakfast  . insulin aspart  0-24 Units Subcutaneous TID AC & HS  . lisinopril  5 mg Oral Daily  . magnesium oxide  400 mg Oral BID  . mouth rinse  15 mL Mouth Rinse BID  . metFORMIN  500 mg Oral Q breakfast  . metoprolol tartrate  25 mg Oral BID  . oxybutynin  5 mg Oral TID  . pantoprazole  40 mg Oral QAC breakfast  . potassium chloride  40 mEq Oral Daily  . sodium chloride flush  3 mL Intravenous Q12H  . tamsulosin  0.4 mg Oral Daily   Continuous Infusions: . sodium chloride    . amiodarone 60 mg/hr (07/21/17 0626)   Followed by  . amiodarone     PRN Meds:.sodium chloride, acetaminophen, bisacodyl **OR** bisacodyl, ondansetron **OR** ondansetron (ZOFRAN) IV, oxyCODONE, sodium chloride flush, traMADol  Xrays No results found.  Assessment/Plan: S/P Procedure(s) (LRB): CORONARY ARTERY BYPASS GRAFTING (CABG) TIMES SIX  USING RIGHT AND LEFT  INTERNAL MAMMARY ARTERIES  AND RIGHT AND LEFT  SAPHENOUS VEIN HARVESTED ENDOSCOPICALLY (N/A) TRANSESOPHAGEAL ECHOCARDIOGRAM (TEE) (N/A)  1  afib with RVR-some PVC's. Now on amio.  K+ 3.8- getting replacement, Mg++ a little low- getting replacement. [facility] if doesn't resolve fairly quickly 2 sugars controlled fairly well- needs better diet control 3 cont diuresis 4 increase lisinopril for some HTN 5 creat is normal - UO is goo 6 cont pulm toilet and routine rehab  LOS: 6 days    Cody Rivers 07/21/2017   Chart reviewed, patient examined, agree with above. Back in sinus on amio. Will continue IV today and switch to oral in the am. If he stays controlled he could go home Saturday. Have multiple stools today. Will DC laxative.

## 2017-07-21 NOTE — Care Management Note (Signed)
Case Management Note Original Note Created Antony HasteBennett, Crystal Harris, RN 07/14/2017, 7:32 PM   Patient Details  Name: Cody Cullensdmund Mette MRN: [id number] Date of Birth: 01/09/1932  Subjective/Objective:                  Chest Pain  Action/Plan: CM spoke with the patient and his son at the bedside at 5C07 (ED). Patient lives with his son. Reports no home health services. Patient does not use any DME. He is able to obtain his medications and get to his physician appointments.   Expected Discharge Date:   unknown               Expected Discharge Plan:  Home/Self Care  In-House Referral:     Discharge planning Services  CM Consult  Post Acute Care Choice:    Choice offered to:     DME Arranged:    DME Agency:     HH Arranged:    HH Agency:     Status of Service:  In process, will continue to follow  If discussed at Long Length of Stay Meetings, dates discussed:    Discharge Disposition:   Additional Comments:  07/21/17- 1030- Therisa Mennella RN, CM- pt s/p CABGx6- 12/8- anticipate return home- post op afib with IV amio- CM will follow for any transition to home needs.   Zenda AlpersWebster, [place] VillageKristi Hall, RN 07/21/2017, 10:31 AM [phone]

## 2017-07-21 NOTE — Progress Notes (Signed)
Progress Note  Patient Name: Cody Rivers Date of Encounter: 07/21/2017  Primary Cardiologist: Dr. Excell Seltzerooper.    Subjective   Developed atrial fib early this AM with RVR.  He has ambulated and actually feels well.    Inpatient Medications    Scheduled Meds: . amiodarone  150 mg Intravenous Once  . aspirin EC  325 mg Oral Daily  . atorvastatin  40 mg Oral q1800  . enoxaparin (LOVENOX) injection  40 mg Subcutaneous QHS  . furosemide  40 mg Oral Daily  . glyBURIDE  2.5 mg Oral Q breakfast  . insulin aspart  0-24 Units Subcutaneous TID AC & HS  . lisinopril  10 mg Oral Daily  . magnesium oxide  400 mg Oral BID  . mouth rinse  15 mL Mouth Rinse BID  . metFORMIN  500 mg Oral Q breakfast  . metoprolol tartrate  25 mg Oral BID  . oxybutynin  5 mg Oral TID  . pantoprazole  40 mg Oral QAC breakfast  . potassium chloride  40 mEq Oral Daily  . sodium chloride flush  3 mL Intravenous Q12H  . tamsulosin  0.4 mg Oral Daily   Continuous Infusions: . sodium chloride    . amiodarone 30 mg/hr (07/21/17 1100)   PRN Meds: sodium chloride, acetaminophen, bisacodyl **OR** bisacodyl, ondansetron **OR** ondansetron (ZOFRAN) IV, oxyCODONE, sodium chloride flush, traMADol   Vital Signs    Vitals:   07/21/17 0149 07/21/17 0151 07/21/17 0400 07/21/17 1030  BP: (!) 146/79 (!) 150/72 135/75 112/74  Pulse:  (!) 52 (!) 58   Resp: 15 12 20    Temp: 98.2 F (36.8 C)  98.4 F (36.9 C)   TempSrc: Oral  Oral   SpO2: 98% 97% 95%   Weight:   216 lb 14.9 oz (98.4 kg)   Height:        Intake/Output Summary (Last 24 hours) at 07/21/2017 1138 Last data filed at 07/21/2017 1040 Gross per 24 hour  Intake 756.3 ml  Output 780 ml  Net -23.7 ml   Filed Weights   07/19/17 0500 07/20/17 0428 07/21/17 0400  Weight: 223 lb 12.3 oz (101.5 kg) 222 lb 10.6 oz (101 kg) 216 lb 14.9 oz (98.4 kg)    Telemetry    Atrial fib with RVR - Personally Reviewed  ECG    NA - Personally Reviewed  Physical  Exam   GEN: No  acute distress.   Neck: No  JVD Cardiac: Irregular RR,  murmurs, rubs, or gallops.  Respiratory: Clear   to auscultation bilaterally. GI: Soft, nontender, non-distended, normal bowel sounds  MS:   edema; No deformity. Neuro:   Nonfocal  Psych: Oriented and appropriate    Labs    Chemistry Recent Labs  Lab 07/16/17 0931  07/18/17 0454 07/19/17 0616 07/21/17 0401  NA 136   < > 138 137 138  K 4.1   < > 4.1 3.7 3.8  CL 107   < > 107 104 104  CO2 21*   < > 24 24 24   GLUCOSE 163*   < > 137* 133* 167*  BUN 19   < > 13 17 19   CREATININE 1.27*   < > 1.03 1.12 1.02  CALCIUM 9.6   < > 8.9 8.9 9.1  PROT 6.4*  --   --   --   --   ALBUMIN 3.6  --   --   --   --   AST 22  --   --   --   --  ALT 18  --   --   --   --   ALKPHOS 57  --   --   --   --   BILITOT 0.6  --   --   --   --   GFRNONAA 50*   < > >60 59* >60  GFRAA 59*   < > >60 >60 >60  ANIONGAP 8   < > 7 9 10    < > = values in this interval not displayed.     Hematology Recent Labs  Lab 07/17/17 1755 07/17/17 1808 07/18/17 0454 07/19/17 0616  WBC 14.0*  --  14.3* 12.7*  RBC 3.31*  --  3.32* 3.20*  HGB 9.7* 9.2* 9.7* 9.3*  HCT 29.1* 27.0* 29.9* 28.5*  MCV 87.9  --  90.1 89.1  MCH 29.3  --  29.2 29.1  MCHC 33.3  --  32.4 32.6  RDW 13.6  --  13.9 13.4  PLT 108*  --  110* 125*    Cardiac Enzymes Recent Labs  Lab 07/14/17 1725 07/14/17 2140 07/15/17 0028  TROPONINI <0.03 <0.03 <0.03    No results for input(s): TROPIPOC in the last 168 hours.  Lab Results  Component Value Date   CHOL 126 07/14/2017   TRIG 188 (H) 07/14/2017   HDL 36 (L) 07/14/2017   LDLCALC 52 07/14/2017    BNPNo results for input(s): BNP, PROBNP in the last 168 hours.   DDimer No results for input(s): DDIMER in the last 168 hours.   Radiology    No results found.  Cardiac Studies   TEE:  07/16/17  Result status:    Aortic valve: The valve is trileaflet. Mild valve thickening present. No stenosis. No  regurgitation.  Mitral valve: Mild leaflet thickening is present.  Right ventricle: Normal cavity size, wall thickness and ejection fraction.  Tricuspid valve: Trace regurgitation.  Pulmonic valve: Trace regurgitation.       Patient Profile     83 y.o. male with hypertension, hyperlipidemia, DM and a history of atrial flutter s/p ablation who was admitted to medicine on 07/14/2017 with new onset chest pain. The ECG was unremarkable and troponin negative. He had a myoview that was positive for ischemia and cath showing severe left main and 3-vessel CAD with diffusely diseased and calcified vessels, normal EF.  He is now status post CABG.     Assessment & Plan    CAD/CABG:  Progressing nicely.  Afib as below.  DYSLIPIDEMIA:  On statin.  Continue current therapy.    ATRIAL FIB:   Now in atrial fib on amiodarone.  Rate is increased.  I will increase the beta blocker.  Continue IV amio for now.  Will consider DOAC if this persists.    For questions or updates, please contact [facility] Please consult [email or web address] for contact info under Cardiology/STEMI.   Signed, Rollene RotundaJames Itzamar Traynor, MD  07/21/2017, 11:38 AM

## 2017-07-21 NOTE — Progress Notes (Signed)
Pt developed an AFIB and RVR, confirm with an EKJ, HR stays in the 125-135s. MD notified new order received.

## 2017-07-21 NOTE — Progress Notes (Signed)
1340 Came to walk with pt earlier this morning and atrial fib 120's so told pt I would come after lunch. Pt now in NSR and I offered a walk. He stated he had gotten laxatives and his stomach was upset and that he would not be able to make it down the hall. Family in room and stated he had walked to bathroom several times. Encouraged pt to walk with staff later today when he is feeling better. Will follow up tomorrow. Luetta Nuttingharlene Morell Mears RN BSN 07/21/2017 1:51 PM

## 2017-07-22 LAB — GLUCOSE, CAPILLARY
GLUCOSE-CAPILLARY: 198 mg/dL — AB (ref 65–99)
Glucose-Capillary: 149 mg/dL — ABNORMAL HIGH (ref 65–99)
Glucose-Capillary: 139 mg/dL — ABNORMAL HIGH (ref 65–99)
Glucose-Capillary: 117 mg/dL — ABNORMAL HIGH (ref 65–99)

## 2017-07-22 LAB — TSH: TSH: 7 u[IU]/mL — ABNORMAL HIGH (ref 0.350–4.500)

## 2017-07-22 LAB — T4, FREE: Free T4: 1.06 ng/dL (ref 0.61–1.12)

## 2017-07-22 MED ORDER — AMIODARONE HCL 200 MG PO TABS
400.0000 mg | ORAL_TABLET | Freq: Two times a day (BID) | ORAL | Status: DC
  Administered 2017-07-22 – 2017-07-23 (×3): 400 mg via ORAL
  Filled 2017-07-22 (×3): qty 2

## 2017-07-22 MED ORDER — METFORMIN HCL 500 MG PO TABS
500.0000 mg | ORAL_TABLET | Freq: Two times a day (BID) | ORAL | Status: DC
  Administered 2017-07-22 – 2017-07-23 (×3): 500 mg via ORAL
  Filled 2017-07-22 (×3): qty 1

## 2017-07-22 MED FILL — Heparin Sodium (Porcine) Inj 1000 Unit/ML: INTRAMUSCULAR | Qty: 30 | Status: AC

## 2017-07-22 MED FILL — Magnesium Sulfate Inj 50%: INTRAMUSCULAR | Qty: 10 | Status: AC

## 2017-07-22 MED FILL — Potassium Chloride Inj 2 mEq/ML: INTRAVENOUS | Qty: 40 | Status: AC

## 2017-07-22 NOTE — Discharge Instructions (Signed)
Endoscopic Saphenous Vein Harvesting, Care After  Refer to this sheet in the next few weeks. These instructions provide you with information about caring for yourself after your procedure. Your health care provider may also give you more specific instructions. Your treatment has been planned according to current medical practices, but problems sometimes occur. Call your health care provider if you have any problems or questions after your procedure. What can I expect after the procedure? After the procedure, it is common to have:  Pain.  Bruising.  Swelling.  Numbness.  Follow these instructions at home: Medicine  Take over-the-counter and prescription medicines only as told by your health care provider.  Do not drive or operate heavy machinery while taking prescription pain medicine. Incision care   Follow instructions from your health care provider about how to take care of the cut made during surgery (incision). Make sure you: ? Wash your hands with soap and water before you change your bandage (dressing). If soap and water are not available, use hand sanitizer. ? Change your dressing as told by your health care provider. ? Leave stitches (sutures), skin glue, or adhesive strips in place. These skin closures may need to be in place for 2 weeks or longer. If adhesive strip edges start to loosen and curl up, you may trim the loose edges. Do not remove adhesive strips completely unless your health care provider tells you to do that.  Check your incision area every day for signs of infection. Check for: ? More redness, swelling, or pain. ? More fluid or blood. ? Warmth. ? Pus or a bad smell. General instructions  Raise (elevate) your legs above the level of your heart while you are sitting or lying down.  Do any exercises your health care providers have given you. These may include deep breathing, coughing, and walking exercises.  Do not shower, take baths, swim, or use a hot  tub unless told by your health care provider.  Wear your elastic stocking if told by your health care provider.  Keep all follow-up visits as told by your health care provider. This is important. Contact a health care provider if:  Medicine does not help your pain.  Your pain gets worse.  You have new leg bruises or your leg bruises get bigger.  You have a fever.  Your leg feels numb.  You have more redness, swelling, or pain around your incision.  You have more fluid or blood coming from your incision.  Your incision feels warm to the touch.  You have pus or a bad smell coming from your incision. Get help right away if:  Your pain is severe.  You develop pain, tenderness, warmth, redness, or swelling in any part of your leg.  You have chest pain.  You have trouble breathing. This information is not intended to replace advice given to you by your health care provider. Make sure you discuss any questions you have with your health care provider. Document Released: 04/07/2011 Document Revised: 01/01/2016 Document Reviewed: 06/09/2015 Elsevier Interactive Patient Education  2018 [place]. Coronary Artery Bypass Grafting, Care After These instructions give you information on caring for yourself after your procedure. Your doctor may also give you more specific instructions. Call your doctor if you have any problems or questions after your procedure. Follow these instructions at home:  Only take medicine as told by your doctor. Take medicines exactly as told. Do not stop taking medicines or start any new medicines without talking to your doctor  first.  Take your pulse as told by your doctor.  Do deep breathing as told by your doctor. Use your breathing device (incentive spirometer), if given, to practice deep breathing several times a day. Support your chest with a pillow or your arms when you take deep breaths or cough.  Keep the area clean, dry, and protected where the  surgery cuts (incisions) were made. Remove bandages (dressings) only as told by your doctor. If strips were applied to surgical area, do not take them off. They fall off on their own.  Check the surgery area daily for puffiness (swelling), redness, or leaking fluid.  If surgery cuts were made in your legs: ? Avoid crossing your legs. ? Avoid sitting for long periods of time. Change positions every 30 minutes. ? Raise your legs when you are sitting. Place them on pillows.  Wear stockings that help keep blood clots from forming in your legs (compression stockings).  Only take sponge baths until your doctor says it is okay to take showers. Pat the surgery area dry. Do not rub the surgery area with a washcloth or towel. Do not bathe, swim, or use a hot tub until your doctor says it is okay.  Eat foods that are high in fiber. These include raw fruits and vegetables, whole grains, beans, and nuts. Choose lean meats. Avoid canned, processed, and fried foods.  Drink enough fluids to keep your pee (urine) clear or pale yellow.  Weigh yourself every day.  Rest and limit activity as told by your doctor. You may be told to: ? Stop any activity if you have chest pain, shortness of breath, changes in heartbeat, or dizziness. Get help right away if this happens. ? Move around often for short amounts of time or take short walks as told by your doctor. Gradually become more active. You may need help to strengthen your muscles and build endurance. ? Avoid lifting, pushing, or pulling anything heavier than 10 pounds (4.5 kg) for at least 6 weeks after surgery.  Do not drive until your doctor says it is okay.  Ask your doctor when you can go back to work.  Ask your doctor when you can begin sexual activity again.  Follow up with your doctor as told. Contact a doctor if:  You have puffiness, redness, more pain, or fluid draining from the incision site.  You have a fever.  You have puffiness in your  ankles or legs.  You have pain in your legs.  You gain 2 or more pounds (0.9 kg) a day.  You feel sick to your stomach (nauseous) or throw up (vomit).  You have watery poop (diarrhea). Get help right away if:  You have chest pain that goes to your jaw or arms.  You have shortness of breath.  You have a fast or irregular heartbeat.  You notice a "clicking" in your breastbone when you move.  You have numbness or weakness in your arms or legs.  You feel dizzy or light-headed. This information is not intended to replace advice given to you by your health care provider. Make sure you discuss any questions you have with your health care provider. Document Released: 07/31/2013 Document Revised: 01/01/2016 Document Reviewed: 01/02/2013 Elsevier Interactive Patient Education  2017 [employer] Inc[employer]    Coronary Artery Bypass Grafting, Care After  This sheet gives you information about how to care for yourself after your procedure. Your health care provider may also give you more specific instructions. If you have problems  or questions, contact your health care provider. What can I expect after the procedure? After the procedure, it is common to have:  Nausea and a lack of appetite.  Constipation.  Weakness and fatigue.  Depression or irritability.  Pain or discomfort in your incision areas.  Follow these instructions at home: Medicines  Take over-the-counter and prescription medicines only as told by your health care provider. Do not stop taking medicines or start any new medicines without approval from your health care provider.  If you were prescribed an antibiotic medicine, take it as told by your health care provider. Do not stop taking the antibiotic even if you start to feel better.  Do not drive or use heavy machinery while taking prescription pain medicine. Incision care  Follow instructions from your health care provider about how to take care of your incisions. Make  sure you: ? Wash your hands with soap and water before you change your bandage (dressing). If soap and water are not available, use hand sanitizer. ? Change your dressing as told by your health care provider. ? Leave stitches (sutures), skin glue, or adhesive strips in place. These skin closures may need to stay in place for 2 weeks or longer. If adhesive strip edges start to loosen and curl up, you may trim the loose edges. Do not remove adhesive strips completely unless your health care provider tells you to do that.  Keep incision areas clean, dry, and protected.  Check your incision areas every day for signs of infection. Check for: ? More redness, swelling, or pain. ? More fluid or blood. ? Warmth. ? Pus or a bad smell.  If incisions were made in your legs: ? Avoid crossing your legs. ? Avoid sitting for long periods of time. Change positions every 30 minutes. ? Raise (elevate) your legs when you are sitting. Bathing  Do not take baths, swim, or use a hot tub until your health care provider approves.  Only take sponge baths. Pat the incisions dry. Do not rub incisions with a washcloth or towel.  Ask your health care provider when you can shower. Eating and drinking  Eat foods that are high in fiber, such as raw fruits and vegetables, whole grains, beans, and nuts. Meats should be lean cut. Avoid canned, processed, and fried foods. This can help prevent constipation and is a recommended part of a heart-healthy diet.  Drink enough fluid to keep your urine clear or pale yellow.  Limit alcohol intake to no more than 1 drink a day for nonpregnant women and 2 drinks a day for men. One drink equals 12 oz of beer, 5 oz of wine, or 1 oz of hard liquor. Activity  Rest and limit your activity as told by your health care provider. You may be instructed to: ? Stop any activity right away if you have chest pain, shortness of breath, irregular heartbeats, or dizziness. Get help right away if  you have any of these symptoms. ? Move around frequently for short periods or take short walks as directed by your health care provider. Gradually increase your activities. You may need physical therapy or cardiac rehabilitation to help strengthen your muscles and build your endurance. ? Avoid lifting, pushing, or pulling anything that is heavier than 10 lb (4.5 kg) for at least 6 weeks or as told by your health care provider.  Do not drive until your health care provider approves.  Ask your health care provider when you may return to work.  Ask your health care provider when you may resume sexual activity. General instructions  Do not use any products that contain nicotine or tobacco, such as cigarettes and e-cigarettes. If you need help quitting, ask your health care provider.  Take 2-3 deep breaths every few hours during the day, while you recover. This helps expand your lungs and prevent complications like pneumonia after surgery.  If you were given a device called an incentive spirometer, use it several times a day to practice deep breathing. Support your chest with a pillow or your arms when you take deep breaths or cough.  Wear compression stockings as told by your health care provider. These stockings help to prevent blood clots and reduce swelling in your legs.  Weigh yourself every day. This helps identify if your body is holding (retaining) fluid that may make your heart and lungs work harder.  Keep all follow-up visits as told by your health care provider. This is important. Contact a health care provider if:  You have more redness, swelling, or pain around any incision.  You have more fluid or blood coming from any incision.  Any incision feels warm to the touch.  You have pus or a bad smell coming from any incision  You have a fever.  You have swelling in your ankles or legs.  You have pain in your legs.  You gain 2 lb (0.9 kg) or more a day.  You are nauseous or  you vomit.  You have diarrhea. Get help right away if:  You have chest pain that spreads to your jaw or arms.  You are short of breath.  You have a fast or irregular heartbeat.  You notice a "clicking" in your breastbone (sternum) when you move.  You have numbness or weakness in your arms or legs.  You feel dizzy or light-headed. Summary  After the procedure, it is common to have pain or discomfort in the incision areas.  Do not take baths, swim, or use a hot tub until your health care provider approves.  Gradually increase your activities. You may need physical therapy or cardiac rehabilitation to help strengthen your muscles and build your endurance.  Weigh yourself every day. This helps identify if your body is holding (retaining) fluid that may make your heart and lungs work harder. This information is not intended to replace advice given to you by your health care provider. Make sure you discuss any questions you have with your health care provider. Document Released: 02/12/2005 Document Revised: 06/14/2016 Document Reviewed: 06/14/2016 Elsevier Interactive Patient Education  [employer]2018 Elsevier Inc.

## 2017-07-22 NOTE — Progress Notes (Signed)
Progress Note  Patient Name: Cody Rivers Date of Encounter: 07/22/2017  Primary Cardiologist: Donato SchultzMark Skains, MD   Subjective   Feeling well. No chest pain, sob or palpitations.   Inpatient Medications    Scheduled Meds: . amiodarone  400 mg Oral BID  . aspirin EC  325 mg Oral Daily  . atorvastatin  40 mg Oral q1800  . enoxaparin (LOVENOX) injection  40 mg Subcutaneous QHS  . glyBURIDE  2.5 mg Oral Q breakfast  . insulin aspart  0-24 Units Subcutaneous TID AC & HS  . lisinopril  10 mg Oral Daily  . magnesium oxide  400 mg Oral BID  . mouth rinse  15 mL Mouth Rinse BID  . metFORMIN  500 mg Oral BID WC  . metoprolol tartrate  37.5 mg Oral BID  . oxybutynin  5 mg Oral TID  . pantoprazole  40 mg Oral QAC breakfast  . sodium chloride flush  3 mL Intravenous Q12H  . tamsulosin  0.4 mg Oral Daily   Continuous Infusions: . sodium chloride     PRN Meds: sodium chloride, acetaminophen, ondansetron **OR** ondansetron (ZOFRAN) IV, oxyCODONE, sodium chloride flush, traMADol   Vital Signs    Vitals:   07/22/17 0300 07/22/17 0400 07/22/17 0544 07/22/17 0839  BP: 109/66 106/67  93/62  Pulse: 66 69    Resp: 19 (!) 21    Temp:  98.9 F (37.2 C)  98.6 F (37 C)  TempSrc:  Oral  Oral  SpO2:  93%  99%  Weight:   215 lb 11.2 oz (97.8 kg)   Height:        Intake/Output Summary (Last 24 hours) at 07/22/2017 1137 Last data filed at 07/22/2017 1125 Gross per 24 hour  Intake 540 ml  Output 1850 ml  Net -1310 ml   Filed Weights   07/20/17 0428 07/21/17 0400 07/22/17 0544  Weight: 222 lb 10.6 oz (101 kg) 216 lb 14.9 oz (98.4 kg) 215 lb 11.2 oz (97.8 kg)    Telemetry    SR - Personally Reviewed  ECG    N/A  Physical Exam   GEN: No acute distress.   Neck: No JVD Cardiac: RRR, no murmurs, rubs, or gallops.  Respiratory: Clear to auscultation bilaterally. GI: Soft, nontender, non-distended  MS: No edema; No deformity. Neuro:  Nonfocal  Psych: Normal affect   Labs     Chemistry Recent Labs  Lab 07/16/17 0931  07/18/17 0454 07/19/17 0616 07/21/17 0401  NA 136   < > 138 137 138  K 4.1   < > 4.1 3.7 3.8  CL 107   < > 107 104 104  CO2 21*   < > 24 24 24   GLUCOSE 163*   < > 137* 133* 167*  BUN 19   < > 13 17 19   CREATININE 1.27*   < > 1.03 1.12 1.02  CALCIUM 9.6   < > 8.9 8.9 9.1  PROT 6.4*  --   --   --   --   ALBUMIN 3.6  --   --   --   --   AST 22  --   --   --   --   ALT 18  --   --   --   --   ALKPHOS 57  --   --   --   --   BILITOT 0.6  --   --   --   --   GFRNONAA 50*   < > >  60 59* >60  GFRAA 59*   < > >60 >60 >60  ANIONGAP 8   < > 7 9 10    < > = values in this interval not displayed.     Hematology Recent Labs  Lab 07/17/17 1755 07/17/17 1808 07/18/17 0454 07/19/17 0616  WBC 14.0*  --  14.3* 12.7*  RBC 3.31*  --  3.32* 3.20*  HGB 9.7* 9.2* 9.7* 9.3*  HCT 29.1* 27.0* 29.9* 28.5*  MCV 87.9  --  90.1 89.1  MCH 29.3  --  29.2 29.1  MCHC 33.3  --  32.4 32.6  RDW 13.6  --  13.9 13.4  PLT 108*  --  110* 125*    Cardiac EnzymesNo results for input(s): TROPONINI in the last 168 hours. No results for input(s): TROPIPOC in the last 168 hours.   BNPNo results for input(s): BNP, PROBNP in the last 168 hours.   DDimer No results for input(s): DDIMER in the last 168 hours.   Radiology    No results found.  Cardiac Studies   TEE:  07/16/17  Result status:    Aortic valve: The valve is trileaflet. Mild valve thickening present. No stenosis. No regurgitation.  Mitral valve: Mild leaflet thickening is present.  Right ventricle: Normal cavity size, wall thickness and ejection fraction.  Tricuspid valve: Trace regurgitation.  Pulmonic valve: Trace regurgitation.    Cath 07/15/17 LEFT HEART CATH AND CORONARY ANGIOGRAPHY  Conclusion   1. Severe left main and multivessel CAD with severe multilevel LAD/diagonal stenosis, severe diffuse RCA stenosis, and severe LCx/OM stenosis 2. Normal LV systolic function 3.  Hypotension, resolved with dopamine and IABP insertion  Plan: TCTS consultation for consideration for consideration of CABG, tx patient to CCU with IABP for support/coronary perfusion. Discussed plan with patient and his son.    Patient Profile        83 y.o. male with hypertension, hyperlipidemia, DM and a history of atrial flutter s/p ablation who was admitted to medicine on 07/14/2017 with new onset chest pain. The ECG was unremarkable and troponin negative. He had a myoview that was positive for ischemia and cath showing severe left main and 3-vessel CAD with diffusely diseased and calcified vessels, normal EF.  He is now status post CABG.   Assessment & Plan    1. CAD s/p CABG - No recurrent chest pain. Continue ASA 325mg , statin, BB nad ACE.   2. Post op atrial fibrillation - Converted to sinus rhythm on IV amiodarone. Now on po amio. Maintaining sinus rhythm.  3. HLD - 07/14/2017: Cholesterol 126; HDL 36; LDL Cholesterol 52; Triglycerides 188; VLDL 38  - Continue statin. LFT and lipid panel in 6 weks.   4. Elevated TSH - Follow up with PCP  5. Volume overload - He is getting PO lasix 40mg  qd. Net N & O negative 1.6L. Normal LV by cath. Low sodium diet. Euvolemic.   For questions or updates, please contact [facility] Please consult [email or web address] for contact info under Cardiology/STEMI.      SignedManson Passey, PA  07/22/2017, 11:37 AM    History and all data above reviewed.  Patient examined.  I agree with the findings as above.  Now back in NSR.  The patient exam reveals COR:RRR  ,  Lungs: Clear  ,  Abd: Positive bowel sounds, no rebound no guarding, Ext No edema  .  All available labs, radiology testing, previous records reviewed. Agree with documented assessment and plan. Back in NSR.  We will continue PO amio for awhile as an out patient.  No indication for DOAC.  Holding ACE today because of low BP.  He likely will not need this at discharge with low BP.  I  would favor continuing the current dose of beta blocker however.  Probably could reduce Lasix to 20 mg daily at discharge.  He has follow scheduled in our clinic for two weeks after discharge.     Fayrene FearingJames Lorilynn Lehr  12:16 PM  07/22/2017

## 2017-07-22 NOTE — Progress Notes (Addendum)
[address]       [place] [id number]             [phone]      6 Days Post-Op Procedure(s) (LRB): CORONARY ARTERY BYPASS GRAFTING (CABG) TIMES SIX  USING RIGHT AND LEFT  INTERNAL MAMMARY ARTERIES  AND RIGHT AND LEFT  SAPHENOUS VEIN HARVESTED ENDOSCOPICALLY (N/A) TRANSESOPHAGEAL ECHOCARDIOGRAM (TEE) (N/A) Subjective: Feels pretty well, sinus rhythm with PVC's  Objective: Vital signs in last 24 hours: Temp:  [98.2 F (36.8 C)-98.9 F (37.2 C)] 98.9 F (37.2 C) (12/14 0400) Pulse Rate:  [62-95] 69 (12/14 0400) Cardiac Rhythm: Normal sinus rhythm (12/13 2200) Resp:  [13-30] 21 (12/14 0400) BP: (100-124)/(58-81) 106/67 (12/14 0400) SpO2:  [92 %-100 %] 93 % (12/14 0400) Weight:  [215 lb 11.2 oz (97.8 kg)] 215 lb 11.2 oz (97.8 kg) (12/14 0544)  Hemodynamic parameters for last 24 hours:    Intake/Output from previous day: 12/13 0701 - 12/14 0700 In: 900 [P.O.:900] Out: 1950 [Urine:1950] Intake/Output this shift: No intake/output data recorded.  General appearance: alert, cooperative and no distress Heart: regular rate and rhythm and occas extrasystole Lungs: clear to auscultation bilaterally Abdomen: benign Extremities: minor edema Wound: incis healing well  Lab Results: No results for input(s): WBC, HGB, HCT, PLT in the last 72 hours. BMET:  Recent Labs    07/21/17 0401  NA 138  K 3.8  CL 104  CO2 24  GLUCOSE 167*  BUN 19  CREATININE 1.02  CALCIUM 9.1    PT/INR: No results for input(s): LABPROT, INR in the last 72 hours. ABG    Component Value Date/Time   PHART 7.382 07/17/2017 0900   HCO3 18.1 (L) 07/17/2017 0900   TCO2 21 (L) 07/17/2017 1808   ACIDBASEDEF 6.0 (H) 07/17/2017 0900   O2SAT 97.0 07/17/2017 0900   CBG (last 3)  Recent Labs    07/21/17 1617 07/21/17 2122 07/22/17 0604  GLUCAP 149* 174* 149*    Meds Scheduled Meds: . amiodarone  150 mg Intravenous Once  . aspirin EC  325 mg Oral Daily  . atorvastatin  40  mg Oral q1800  . enoxaparin (LOVENOX) injection  40 mg Subcutaneous QHS  . furosemide  40 mg Oral Daily  . glyBURIDE  2.5 mg Oral Q breakfast  . insulin aspart  0-24 Units Subcutaneous TID AC & HS  . lisinopril  10 mg Oral Daily  . magnesium oxide  400 mg Oral BID  . mouth rinse  15 mL Mouth Rinse BID  . metFORMIN  500 mg Oral Q breakfast  . metoprolol tartrate  37.5 mg Oral BID  . oxybutynin  5 mg Oral TID  . pantoprazole  40 mg Oral QAC breakfast  . potassium chloride  40 mEq Oral Daily  . sodium chloride flush  3 mL Intravenous Q12H  . tamsulosin  0.4 mg Oral Daily   Continuous Infusions: . sodium chloride    . amiodarone 30 mg/hr (07/21/17 2338)   PRN Meds:.sodium chloride, acetaminophen, ondansetron **OR** ondansetron (ZOFRAN) IV, oxyCODONE, sodium chloride flush, traMADol  Xrays No results found.  Assessment/Plan: S/P Procedure(s) (LRB): CORONARY ARTERY BYPASS GRAFTING (CABG) TIMES SIX  USING RIGHT AND LEFT  INTERNAL MAMMARY ARTERIES  AND RIGHT AND LEFT  SAPHENOUS VEIN HARVESTED ENDOSCOPICALLY (N/A) TRANSESOPHAGEAL ECHOCARDIOGRAM (TEE) (N/A)   1 holding sinus with PVC's, will transition to po amiodarone, cont current metoprolol dose. Will also check TSH 2 will increase metformin dose- discussed lower carb/sugar  diet 3 BP well controlled on current meds 4 diuresing well- will be able to d/c lasix soon 5 cont rehab/pulm toilet- routine 6 poss home in am   LOS: 7 days    Cody Rivers 07/22/2017   Chart reviewed, patient examined, agree with above. Telemetry reviewed and he has been maintaining sinus rhythm on IV amio. Will switch to po. If he maintains sinus he could go home tomorrow. Otherwise he feels well.

## 2017-07-22 NOTE — Progress Notes (Addendum)
CARDIAC REHAB PHASE I   PRE:  Rate/Rhythm: 68 SR    BP: sitting 128/68    SaO2: 94 RA  MODE:  Ambulation: 420 ft   POST:  Rate/Rhythm: 78 SR    BP: sitting 130/64     SaO2: 99 RA  Pt reluctant to walk, in bed. Able to get out of bed independently but needed many verbal cues on sternal precautions. Pt tends to do what he wants to do. Fairly steady in hall, slow walk. Took x1 standing rest stop. No Afib. To recliner. VSS. Ed completed with pt and daughter. Stressed importance of mobility at home, x3 walks a day. His daughter is checking if he still has a RW. I suggest HHPT initially as pt does have 20 stairs to his bedroom plus will need scheduled exercise to motivate him. Will refer to [place] [facility].  [id number]  Harriet MassonRandi Kristan Noelani Harbach CES, ACSM 07/22/2017 10:47 AM

## 2017-07-23 LAB — GLUCOSE, CAPILLARY
GLUCOSE-CAPILLARY: 165 mg/dL — AB (ref 65–99)
GLUCOSE-CAPILLARY: 184 mg/dL — AB (ref 65–99)

## 2017-07-23 MED ORDER — ATORVASTATIN CALCIUM 40 MG PO TABS: 40.0000 mg | ORAL_TABLET | Freq: Every day | ORAL | 1 refills | Status: DC

## 2017-07-23 MED ORDER — METOPROLOL TARTRATE 25 MG PO TABS: 25.0000 mg | ORAL_TABLET | Freq: Two times a day (BID) | ORAL | Status: DC

## 2017-07-23 MED ORDER — AMIODARONE HCL 200 MG PO TABS: 200.0000 mg | ORAL_TABLET | Freq: Two times a day (BID) | ORAL | 1 refills | Status: DC

## 2017-07-23 MED ORDER — ASPIRIN 325 MG PO TBEC: 325.0000 mg | DELAYED_RELEASE_TABLET | Freq: Every day | ORAL | 0 refills | Status: DC

## 2017-07-23 MED ORDER — FUROSEMIDE 20 MG PO TABS: 20.0000 mg | ORAL_TABLET | Freq: Every day | ORAL | 0 refills | Status: DC

## 2017-07-23 MED ORDER — POTASSIUM CHLORIDE ER 10 MEQ PO TBCR: 10.0000 meq | EXTENDED_RELEASE_TABLET | Freq: Every day | ORAL | 0 refills | Status: DC

## 2017-07-23 MED ORDER — AMIODARONE HCL 200 MG PO TABS: 200.0000 mg | ORAL_TABLET | Freq: Two times a day (BID) | ORAL | Status: DC

## 2017-07-23 MED ORDER — METOPROLOL TARTRATE 25 MG PO TABS: 25.0000 mg | ORAL_TABLET | Freq: Two times a day (BID) | ORAL | 1 refills | Status: DC

## 2017-07-23 MED ORDER — ACETAMINOPHEN 325 MG PO TABS: 650.0000 mg | ORAL_TABLET | Freq: Four times a day (QID) | ORAL | Status: AC | PRN

## 2017-07-23 MED ORDER — TRAMADOL HCL 50 MG PO TABS: 50.0000 mg | ORAL_TABLET | Freq: Four times a day (QID) | ORAL | 0 refills | Status: DC | PRN

## 2017-07-23 NOTE — Progress Notes (Addendum)
      [address]       [place] [id number]             [phone]        7 Days Post-Op Procedure(s) (LRB): CORONARY ARTERY BYPASS GRAFTING (CABG) TIMES SIX  USING RIGHT AND LEFT  INTERNAL MAMMARY ARTERIES  AND RIGHT AND LEFT  SAPHENOUS VEIN HARVESTED ENDOSCOPICALLY (N/A) TRANSESOPHAGEAL ECHOCARDIOGRAM (TEE) (N/A)  Subjective: Patient without complaints. He wants to go home.  Objective: Vital signs in last 24 hours: Temp:  [97.9 F (36.6 C)-98.7 F (37.1 C)] 98.2 F (36.8 C) (12/15 0359) Pulse Rate:  [72-78] 75 (12/15 0935) Cardiac Rhythm: Normal sinus rhythm (12/15 0845) Resp:  [17-24] 24 (12/15 0359) BP: (102-127)/(55-73) 115/62 (12/15 0935) SpO2:  [93 %-99 %] 95 % (12/15 0359) Weight:  [214 lb 15.2 oz (97.5 kg)] 214 lb 15.2 oz (97.5 kg) (12/15 0615)  Pre op weight 97 kg Current Weight  07/23/17 214 lb 15.2 oz (97.5 kg)      Intake/Output from previous day: 12/14 0701 - 12/15 0700 In: 475 [P.O.:475] Out: 601 [Urine:600; Stool:1]   Physical Exam:  Cardiovascular: RRR Pulmonary: Clear to auscultation bilaterally Abdomen: Soft, obese,non tender, bowel sounds present. Extremities: Trace bilateral lower extremity edema. Wounds: Clean and dry.  No erythema or signs of infection.  Lab Results: CBC:No results for input(s): WBC, HGB, HCT, PLT in the last 72 hours. BMET:  Recent Labs    07/21/17 0401  NA 138  K 3.8  CL 104  CO2 24  GLUCOSE 167*  BUN 19  CREATININE 1.02  CALCIUM 9.1    PT/INR:  Lab Results  Component Value Date   INR 1.30 07/16/2017   INR 1.07 07/16/2017   INR 1.00 07/15/2017   ABG:  INR: Will add last result for INR, ABG once components are confirmed Will add last 4 CBG results once components are confirmed  Assessment/Plan:  1. CV - Previous a fib. Maintaining SR. On Amiodarone 400 mg bid, Lopressor 37.5 mg bid. Will decrease Amiodarone and Lopressor as HR in the 60's . 2.  Pulmonary - On room air. Encourage  incentive spirometer. 3. DM-CBGs 139/117/165. On Metformin 500 mg bid and Glyburide 2.5 mg daily. 4.  Acute blood loss anemia - Last H and H 9.3 and 28.5 5. TSH elevated at 7. Not on medication prior to admission. Will need follow up with medical doctor after discharge.  6. We will remove chest tube sutures after discharge in the office 7. As discussed with Dr. Donata ClayVan Trigt, will discharge  Cody Claros M ZimmermanPA-C 07/23/2017,11:12 AM

## 2017-07-23 NOTE — Progress Notes (Signed)
07/23/2017  1425 Discharge AVS meds taken today and those due this evening reviewed.  Follow-up appointments and when to call md reviewed.  D/C IV and TELE.  Questions and concerns addressed.   D/C home per orders. Kathryne HitchAllen, Jakorian Marengo C

## 2017-07-25 LAB — T3, FREE: T3, Free: 2.1 pg/mL (ref 2.0–4.4)

## 2017-07-27 ENCOUNTER — Ambulatory Visit (INDEPENDENT_AMBULATORY_CARE_PROVIDER_SITE_OTHER): Payer: Self-pay

## 2017-07-27 DIAGNOSIS — Z4802 Encounter for removal of sutures: Secondary | ICD-10-CM

## 2017-07-27 NOTE — Progress Notes (Signed)
Patient presents for suture removal. The wound is well healed without signs of infection.  4 sutures are removed. Wound care and activity instructions given. Return prn.

## 2017-07-28 ENCOUNTER — Telehealth (HOSPITAL_COMMUNITY): Payer: Self-pay

## 2017-07-28 NOTE — Telephone Encounter (Signed)
Patients insurance is active and benefits verified through [facility] - $20.00 co-pay, no deductible, out of pocket amount of $4,400/$85.36 has been met, no co-insurance, and no pre-authorization is required. Passport/reference [phone]  Patient will be contacted and scheduled after their follow up appt with the Cardiologist office upon review by the RN Navigator.

## 2017-08-04 NOTE — Progress Notes (Deleted)
Cardiology Office Note    Date:  08/04/2017   ID:  Rjay Revolorio, DOB 1931-09-25, MRN [id number]  PCP:  Joycelyn Rua, MD  Cardiologist:  Dr. Anne Fu  EP : Allred   Chief Complaint: Hospital follow up s/p  CABG  History of Present Illness:   Cody Rivers is a 83 y.o. male with a hx of Afib s/p ablation (2010),  HTN , DM Type II, urine incontinence, and arthritis presented for hospital follow up.  Admitted 07/2017 for chest pain. Stress test was abnormal with possible ischemia and cath showing severe left main and 3-vessel CAD with diffusely diseased and calcified vessels, normal EF. Subsequently underwent Coronary artery bypass grafting x 6 (LIMA to the LAD, RIMA to the diagonal, SVG to Ramus, Sequential SVG to OM1 and OM2 , SVG to PDA). Had post po afib. Converted to sinus rhythm on amiodarone. No indication for DOAC.   Diuresed 1.6L. Continued low dose BB. Holding ACE today because of low BP. Reduced lasix to 20mg  at discharge.   Here today for follow up.              Past Medical History:  Diagnosis Date  . Arthritis   . Atrial flutter (HCC) 11/20/2008   Qualifier: Diagnosis of  By: Johney Frame, MD, Fayrene Fearing    . Chest pain with moderate risk for cardiac etiology 07/14/2017  . Diabetes mellitus without complication (HCC)   . Hyperlipidemia   . Hypertension     Past Surgical History:  Procedure Laterality Date  . ATRIAL FLUTTER ABLATION  2010   Dr Johney Frame  . CORONARY ARTERY BYPASS GRAFT N/A 07/16/2017   Procedure: CORONARY ARTERY BYPASS GRAFTING (CABG) TIMES SIX  USING RIGHT AND LEFT  INTERNAL MAMMARY ARTERIES  AND RIGHT AND LEFT  SAPHENOUS VEIN HARVESTED ENDOSCOPICALLY;  Surgeon: Alleen Borne, MD;  Location: [facility] OR;  Service: Open Heart Surgery;  Laterality: N/A;  . LEFT HEART CATH AND CORONARY ANGIOGRAPHY N/A 07/15/2017   Procedure: LEFT HEART CATH AND CORONARY ANGIOGRAPHY;  Surgeon: Tonny Bollman, MD;  Location[facility]VASIVE CV LAB;  Service: Cardiovascular;   Laterality: N/A;  . REPLACEMENT TOTAL KNEE BILATERAL    . ROTATOR CUFF REPAIR     LEFT  . TEE WITHOUT CARDIOVERSION N/A 07/16/2017   Procedure: TRANSESOPHAGEAL ECHOCARDIOGRAM (TEE);  Surgeon: Alleen Borne, MD;  Location: [facility] OR;  Service: Open Heart Surgery;  Laterality: N/A;    Current Medications: Prior to Admission medications   Medication Sig Start Date End Date Taking? Authorizing Provider  acetaminophen (TYLENOL) 325 MG tablet Take 2 tablets (650 mg total) by mouth every 6 (six) hours as needed for mild pain. 07/23/17   Ardelle Balls, PA-C  amiodarone (PACERONE) 200 MG tablet Take 1 tablet (200 mg total) by mouth 2 (two) times daily. For 5 days;then take Amiodarone 200 mg by mouth daily thereafter 07/23/17   Ardelle Balls, PA-C  aspirin EC 325 MG EC tablet Take 1 tablet (325 mg total) by mouth daily. 07/24/17   Ardelle Balls, PA-C  atorvastatin (LIPITOR) 40 MG tablet Take 1 tablet (40 mg total) by mouth daily at 6 PM. 07/23/17   Ardelle Balls, PA-C  calcium carbonate (TUMS - DOSED IN MG ELEMENTAL CALCIUM) 500 MG chewable tablet Chew 1 tablet by mouth daily.    [provider]  docusate sodium (COLACE) 100 MG capsule Take 100 mg by mouth 2 (two) times daily.    [provider]  furosemide (LASIX) 20 MG  tablet Take 1 tablet (20 mg total) by mouth daily. For one week then stop. 07/23/17   Doree FudgeZimmerman, Donielle M, PA-C  glyBURIDE-metformin (GLUCOVANCE) 2.5-500 MG tablet Take 1 tablet by mouth daily with breakfast.    [provider]  Hypromellose (ARTIFICIAL TEARS OP) Place 1 drop into both eyes daily as needed (dry eyes).    [provider]  ketoconazole (NIZORAL) 2 % cream Apply 1 application topically daily. To foot rash 07/05/17   [provider]  metoprolol tartrate (LOPRESSOR) 25 MG tablet Take 1 tablet (25 mg total) by mouth 2 (two) times daily. 07/23/17   Ardelle BallsZimmerman, Donielle M, PA-C  Multiple Vitamin  (MULTIVITAMIN) tablet Take 1 tablet by mouth daily.    [provider]  Multiple Vitamins-Minerals (PRESERVISION AREDS PO) Take 1 tablet by mouth daily.    [provider]  oxybutynin (DITROPAN) 5 MG tablet Take 5 mg by mouth 3 (three) times daily.    [provider]  potassium chloride (K-DUR) 10 MEQ tablet Take 1 tablet (10 mEq total) by mouth daily. For one week then stop. 07/23/17   Ardelle BallsZimmerman, Donielle M, PA-C  tamsulosin (FLOMAX) 0.4 MG CAPS capsule Take 0.4 mg by mouth daily.    [provider]  traMADol (ULTRAM) 50 MG tablet Take 1 tablet (50 mg total) by mouth every 6 (six) hours as needed for severe pain. 07/23/17   Ardelle BallsZimmerman, Donielle M, PA-C  vitamin A [id number] UNIT capsule Take 10,000 Units by mouth daily.    [provider]  vitamin B-12 (CYANOCOBALAMIN) 1000 MCG tablet Take 1,000 mcg by mouth daily.    [provider]    Allergies:   Robitussin 12 hour cough [dextromethorphan polistirex er]   Social History   Socioeconomic History  . Marital status: Widowed    Spouse name: Not on file  . Number of children: Not on file  . Years of education: Not on file  . Highest education level: Not on file  Social Needs  . Financial resource strain: Not on file  . Food insecurity - worry: Not on file  . Food insecurity - inability: Not on file  . Transportation needs - medical: Not on file  . Transportation needs - non-medical: Not on file  Occupational History  . Occupation: retired  Tobacco Use  . Smoking status: Never Smoker  . Smokeless tobacco: Never Used  Substance and Sexual Activity  . Alcohol use: Yes  . Drug use: No  . Sexual activity: No  Other Topics Concern  . Not on file  Social History Narrative   Pt lives with his son.      Family History:  The patient's family history includes Cancer in his father; Diabetes (age of onset: 5690) in his maternal grandfather. ***  ROS:   Please see the history of present  illness.    ROS All other systems reviewed and are negative.   PHYSICAL EXAM:   VS:  There were no vitals taken for this visit.   GEN: Well nourished, well developed, in no acute distress  HEENT: normal  Neck: no JVD, carotid bruits, or masses Cardiac: ***RRR; no murmurs, rubs, or gallops,no edema  Respiratory:  clear to auscultation bilaterally, normal work of breathing GI: soft, nontender, nondistended, + BS MS: no deformity or atrophy  Skin: warm and dry, no rash Neuro:  Alert and Oriented x 3, Strength and sensation are intact Psych: euthymic mood, full affect  Wt Readings from Last 3 Encounters:  07/23/17 214  lb 15.2 oz (97.5 kg)  09/20/15 228 lb (103.4 kg)  11/20/08 (!) 233 lb 12 oz (106 kg)      Studies/Labs Reviewed:   EKG:  EKG is ordered today.  The ekg ordered today demonstrates ***  Recent Labs: 07/16/2017: ALT 18 07/19/2017: Hemoglobin 9.3; Platelets 125 07/20/2017: Magnesium 1.8 07/21/2017: BUN 19; Creatinine, Ser 1.02; Potassium 3.8; Sodium 138 07/22/2017: TSH 7.000   Lipid Panel    Component Value Date/Time   CHOL 126 07/14/2017 1725   TRIG 188 (H) 07/14/2017 1725   HDL 36 (L) 07/14/2017 1725   CHOLHDL 3.5 07/14/2017 1725   VLDL 38 07/14/2017 1725   LDLCALC 52 07/14/2017 1725    Additional studies/ records that were reviewed today include:     TEE: 07/16/17  Result status:    Aortic valve: The valve is trileaflet. Mild valve thickening present. No stenosis. No regurgitation.  Mitral valve: Mild leaflet thickening is present.  Right ventricle: Normal cavity size, wall thickness and ejection fraction.  Tricuspid valve: Trace regurgitation.  Pulmonic valve: Trace regurgitation.    Cath 07/15/17 LEFT HEART CATH AND CORONARY ANGIOGRAPHY  Conclusion   1. Severe left main and multivessel CAD with severe multilevel LAD/diagonal stenosis, severe diffuse RCA stenosis, and severe LCx/OM stenosis 2. Normal LV systolic function 3.  Hypotension, resolved with dopamine and IABP insertion  Plan: TCTS consultation for consideration for consideration of CABG, tx patient to CCU with IABP for support/coronary perfusion. Discussed plan with patient and his son.       ASSESSMENT & PLAN:    1. CAD s/p CABG x 6  2. HLD - 07/14/2017: Cholesterol 126; HDL 36; LDL Cholesterol 52; Triglycerides 188; VLDL 38  - Continue statin. LFT and lipid panel in 2 weeks.  3. Post op Afib - Maintaining sinus. Will review with Dr. Anne FuSkains if ok to DC amiodarone.   4. HTN    Medication Adjustments/Labs and Tests Ordered: Current medicines are reviewed at length with the patient today.  Concerns regarding medicines are outlined above.  Medication changes, Labs and Tests ordered today are listed in the Patient Instructions below. There are no Patient Instructions on file for this visit.   Lorelei PontSigned, Duante Arocho, GeorgiaPA  08/04/2017 4:29 PM    [facility] [address], [place], [place]  [address] Phone: [phone]; Fax: [phone]

## 2017-08-08 ENCOUNTER — Other Ambulatory Visit: Payer: Self-pay

## 2017-08-08 ENCOUNTER — Encounter (HOSPITAL_BASED_OUTPATIENT_CLINIC_OR_DEPARTMENT_OTHER): Payer: Self-pay | Admitting: Emergency Medicine

## 2017-08-08 ENCOUNTER — Emergency Department (HOSPITAL_BASED_OUTPATIENT_CLINIC_OR_DEPARTMENT_OTHER)
Admission: EM | Admit: 2017-08-08 | Discharge: 2017-08-08 | Disposition: A | Discharge status: Home / Self Care | Payer: Medicare Other | Attending: Emergency Medicine | Admitting: Emergency Medicine

## 2017-08-08 DIAGNOSIS — E119 Type 2 diabetes mellitus without complications: Secondary | ICD-10-CM | POA: Insufficient documentation

## 2017-08-08 DIAGNOSIS — Z951 Presence of aortocoronary bypass graft: Secondary | ICD-10-CM | POA: Diagnosis not present

## 2017-08-08 DIAGNOSIS — Z7984 Long term (current) use of oral hypoglycemic drugs: Secondary | ICD-10-CM | POA: Diagnosis not present

## 2017-08-08 DIAGNOSIS — R112 Nausea with vomiting, unspecified: Secondary | ICD-10-CM

## 2017-08-08 DIAGNOSIS — Z79899 Other long term (current) drug therapy: Secondary | ICD-10-CM | POA: Diagnosis not present

## 2017-08-08 DIAGNOSIS — Z96653 Presence of artificial knee joint, bilateral: Secondary | ICD-10-CM | POA: Insufficient documentation

## 2017-08-08 DIAGNOSIS — N390 Urinary tract infection, site not specified: Secondary | ICD-10-CM | POA: Diagnosis not present

## 2017-08-08 DIAGNOSIS — Z7982 Long term (current) use of aspirin: Secondary | ICD-10-CM | POA: Insufficient documentation

## 2017-08-08 DIAGNOSIS — I10 Essential (primary) hypertension: Secondary | ICD-10-CM | POA: Diagnosis not present

## 2017-08-08 DIAGNOSIS — I2510 Atherosclerotic heart disease of native coronary artery without angina pectoris: Secondary | ICD-10-CM | POA: Insufficient documentation

## 2017-08-08 HISTORY — DX: Atherosclerotic heart disease of native coronary artery without angina pectoris: I25.10

## 2017-08-08 LAB — URINALYSIS, ROUTINE W REFLEX MICROSCOPIC
Bilirubin Urine: NEGATIVE
Glucose, UA: NEGATIVE mg/dL
Ketones, ur: NEGATIVE mg/dL
Nitrite: POSITIVE — AB
Protein, ur: 30 mg/dL — AB
Specific Gravity, Urine: 1.015 (ref 1.005–1.030)
pH: 7 (ref 5.0–8.0)

## 2017-08-08 LAB — CBC
HCT: 33.6 % — ABNORMAL LOW (ref 39.0–52.0)
Hemoglobin: 11 g/dL — ABNORMAL LOW (ref 13.0–17.0)
MCH: 28.8 pg (ref 26.0–34.0)
MCHC: 32.7 g/dL (ref 30.0–36.0)
MCV: 88 fL (ref 78.0–100.0)
Platelets: 384 10*3/uL (ref 150–400)
RBC: 3.82 MIL/uL — ABNORMAL LOW (ref 4.22–5.81)
RDW: 13.5 % (ref 11.5–15.5)
WBC: 11.2 10*3/uL — ABNORMAL HIGH (ref 4.0–10.5)

## 2017-08-08 LAB — COMPREHENSIVE METABOLIC PANEL
ALT: 16 U/L — ABNORMAL LOW (ref 17–63)
AST: 21 U/L (ref 15–41)
Albumin: 3.3 g/dL — ABNORMAL LOW (ref 3.5–5.0)
Alkaline Phosphatase: 80 U/L (ref 38–126)
Anion gap: 13 (ref 5–15)
BUN: 14 mg/dL (ref 6–20)
CO2: 20 mmol/L — ABNORMAL LOW (ref 22–32)
Calcium: 9.7 mg/dL (ref 8.9–10.3)
Chloride: 106 mmol/L (ref 101–111)
Creatinine, Ser: 1.25 mg/dL — ABNORMAL HIGH (ref 0.61–1.24)
GFR calc Af Amer: 60 mL/min — ABNORMAL LOW (ref >60)
GFR calc non Af Amer: 51 mL/min — ABNORMAL LOW (ref >60)
Glucose, Bld: 163 mg/dL — ABNORMAL HIGH (ref 65–99)
Potassium: 3.9 mmol/L (ref 3.5–5.1)
Sodium: 139 mmol/L (ref 135–145)
Total Bilirubin: 0.7 mg/dL (ref 0.3–1.2)
Total Protein: 6.9 g/dL (ref 6.5–8.1)

## 2017-08-08 LAB — URINALYSIS, MICROSCOPIC (REFLEX)

## 2017-08-08 LAB — LIPASE, BLOOD: Lipase: 22 U/L (ref 11–51)

## 2017-08-08 MED ORDER — CEFTRIAXONE SODIUM 2 G IJ SOLR
2.0000 g | Freq: Once | INTRAMUSCULAR | Status: AC
  Administered 2017-08-08: 2 g via INTRAVENOUS
  Filled 2017-08-08: qty 2

## 2017-08-08 MED ORDER — ONDANSETRON HCL 4 MG PO TABS: 4.0000 mg | ORAL_TABLET | Freq: Four times a day (QID) | ORAL | 0 refills | Status: DC | PRN

## 2017-08-08 MED ORDER — CEPHALEXIN 500 MG PO CAPS: 500.0000 mg | ORAL_CAPSULE | Freq: Three times a day (TID) | ORAL | 0 refills | Status: DC

## 2017-08-08 MED ORDER — ONDANSETRON HCL 4 MG/2ML IJ SOLN
4.0000 mg | Freq: Once | INTRAMUSCULAR | Status: AC
  Administered 2017-08-08: 4 mg via INTRAVENOUS
  Filled 2017-08-08: qty 2

## 2017-08-08 MED FILL — CEPHALEXIN 500 MG CAPSULE: 500 | 5 days supply | Qty: 15 | Fill #0

## 2017-08-08 MED FILL — ONDANSETRON HCL 4 MG TABLET: 4 | 4 days supply | Qty: 12 | Fill #0

## 2017-08-08 NOTE — ED Triage Notes (Signed)
Pt reports vomiting since last night, s/p CABG 3 weeks ago. Denies pain

## 2017-08-08 NOTE — ED Notes (Signed)
NAD at this time. Pt is stable and going home.  

## 2017-08-08 NOTE — ED Provider Notes (Signed)
[facility] EMERGENCY DEPARTMENT Provider Note   CSN: [id number] Arrival date & time: 08/08/17  7829     History   Chief Complaint Chief Complaint  Patient presents with  . Emesis    HPI Cody Rivers is a 83 y.o. male.  HPI   83 year old male with nausea and vomiting.  Symptom onset yesterday.  He is status post CABG approximately 3 weeks ago.  He reports that he has been recovering pretty well up until yesterday.  Denies any abdominal pain.  No diarrhea.  Multiple sick contacts.  He has vomited 4 times since yesterday.  No fevers or chills.  Has not tried taking anything for his symptoms.  Past Medical History:  Diagnosis Date  . Arthritis   . Atrial flutter (HCC) 11/20/2008   Qualifier: Diagnosis of  By: Johney Frame, MD, Fayrene Fearing    . Chest pain with moderate risk for cardiac etiology 07/14/2017  . Coronary artery disease   . Diabetes mellitus without complication (HCC)   . Hyperlipidemia   . Hypertension     Patient Active Problem List   Diagnosis Date Noted  . 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  . Abnormal nuclear stress test 07/15/2017  . Chest pain 07/15/2017  . Coronary artery disease involving native coronary artery of native heart with unstable angina pectoris (HCC)   . Hypertension 07/14/2017  . Chest pain with moderate risk for cardiac etiology 07/14/2017  . Diabetes (HCC) 12/03/2008  . Hyperlipidemia 12/03/2008  . ALLERGIC RHINITIS 12/03/2008  . BENIGN PROSTATIC HYPERTROPHY, WITH URINARY OBSTRUCTION 12/03/2008  . DEGENERATIVE JOINT DISEASE 12/03/2008  . HYPERTENSION, BENIGN 11/20/2008  . Atrial flutter [facility]), s/p ablation 2010 11/20/2008    Past Surgical History:  Procedure Laterality Date  . ATRIAL FLUTTER ABLATION  2010   Dr Johney Frame  . CARDIAC SURGERY    . CORONARY ARTERY BYPASS GRAFT N/A 07/16/2017   Procedure: CORONARY ARTERY BYPASS GRAFTING (CABG) TIMES SIX  USING RIGHT AND LEFT  INTERNAL MAMMARY ARTERIES  AND RIGHT AND LEFT  SAPHENOUS VEIN HARVESTED  ENDOSCOPICALLY;  Surgeon: Alleen Borne, MD;  Location: [facility] OR;  Service: Open Heart Surgery;  Laterality: N/A;  . LEFT HEART CATH AND CORONARY ANGIOGRAPHY N/A 07/15/2017   Procedure: LEFT HEART CATH AND CORONARY ANGIOGRAPHY;  Surgeon: Tonny Bollman, MD;  Location: [facility] INVASIVE CV LAB;  Service: Cardiovascular;  Laterality: N/A;  . REPLACEMENT TOTAL KNEE BILATERAL    . ROTATOR CUFF REPAIR     LEFT  . TEE WITHOUT CARDIOVERSION N/A 07/16/2017   Procedure: TRANSESOPHAGEAL ECHOCARDIOGRAM (TEE);  Surgeon: Alleen Borne, MD;  Location: [facility] OR;  Service: Open Heart Surgery;  Laterality: N/A;       Home Medications    Prior to Admission medications   Medication Sig Start Date End Date Taking? Authorizing Provider  acetaminophen (TYLENOL) 325 MG tablet Take 2 tablets (650 mg total) by mouth every 6 (six) hours as needed for mild pain. 07/23/17   Ardelle Balls, PA-C  amiodarone (PACERONE) 200 MG tablet Take 1 tablet (200 mg total) by mouth 2 (two) times daily. For 5 days;then take Amiodarone 200 mg by mouth daily thereafter 07/23/17   Ardelle Balls, PA-C  aspirin EC 325 MG EC tablet Take 1 tablet (325 mg total) by mouth daily. 07/24/17   Ardelle Balls, PA-C  atorvastatin (LIPITOR) 40 MG tablet Take 1 tablet (40 mg total) by mouth daily at 6 PM. 07/23/17   Ardelle Balls, PA-C  calcium carbonate (TUMS -  DOSED IN MG ELEMENTAL CALCIUM) 500 MG chewable tablet Chew 1 tablet by mouth daily.    [provider]  docusate sodium (COLACE) 100 MG capsule Take 100 mg by mouth 2 (two) times daily.    [provider]  furosemide (LASIX) 20 MG tablet Take 1 tablet (20 mg total) by mouth daily. For one week then stop. 07/23/17   Doree FudgeZimmerman, Donielle M, PA-C  glyBURIDE-metformin (GLUCOVANCE) 2.5-500 MG tablet Take 1 tablet by mouth daily with breakfast.    [provider]  Hypromellose (ARTIFICIAL TEARS OP) Place 1 drop into both eyes daily as needed (dry  eyes).    [provider]  ketoconazole (NIZORAL) 2 % cream Apply 1 application topically daily. To foot rash 07/05/17   [provider]  metoprolol tartrate (LOPRESSOR) 25 MG tablet Take 1 tablet (25 mg total) by mouth 2 (two) times daily. 07/23/17   Ardelle BallsZimmerman, Donielle M, PA-C  Multiple Vitamin (MULTIVITAMIN) tablet Take 1 tablet by mouth daily.    [provider]  Multiple Vitamins-Minerals (PRESERVISION AREDS PO) Take 1 tablet by mouth daily.    [provider]  oxybutynin (DITROPAN) 5 MG tablet Take 5 mg by mouth 3 (three) times daily.    [provider]  potassium chloride (K-DUR) 10 MEQ tablet Take 1 tablet (10 mEq total) by mouth daily. For one week then stop. 07/23/17   Ardelle BallsZimmerman, Donielle M, PA-C  tamsulosin (FLOMAX) 0.4 MG CAPS capsule Take 0.4 mg by mouth daily.    [provider]  traMADol (ULTRAM) 50 MG tablet Take 1 tablet (50 mg total) by mouth every 6 (six) hours as needed for severe pain. 07/23/17   Ardelle BallsZimmerman, Donielle M, PA-C  vitamin A [id number] UNIT capsule Take 10,000 Units by mouth daily.    [provider]  vitamin B-12 (CYANOCOBALAMIN) 1000 MCG tablet Take 1,000 mcg by mouth daily.    [provider]    Family History Family History  Problem Relation Age of Onset  . Cancer Father   . Diabetes Maternal Grandfather 6490    Social History Social History   Tobacco Use  . Smoking status: Never Smoker  . Smokeless tobacco: Never Used  Substance Use Topics  . Alcohol use: Yes  . Drug use: No     Allergies   Robitussin 12 hour cough [dextromethorphan polistirex er]   Review of Systems Review of Systems  All systems reviewed and negative, other than as noted in HPI.  Physical Exam Updated Vital Signs BP 127/61   Pulse (!) 48   Temp 97.8 F (36.6 C) (Oral)   Resp (!) 21   Ht 5\' 10"  (1.778 m)   Wt 97.1 kg (214 lb)   SpO2 99%   BMI 30.71 kg/m   Physical Exam  Constitutional: He  appears well-developed and well-nourished. No distress.  HENT:  Head: Normocephalic and atraumatic.  Eyes: Conjunctivae are normal. Right eye exhibits no discharge. Left eye exhibits no discharge.  Neck: Neck supple.  Cardiovascular: Regular rhythm and normal heart sounds. Exam reveals no gallop and no friction rub.  No murmur heard. Bradycardia.  Externally, his sternotomy appears to be healing well.  Pulmonary/Chest: Effort normal and breath sounds normal. No respiratory distress.  Abdominal: Soft. He exhibits no distension. There is no tenderness.  Musculoskeletal: He exhibits edema. He exhibits no tenderness.  Mild asymmetric edema of lower extremities, right greater than left.  Surgical incisions to bilateral lower extremities appear to be healing without complication.  Neurological:  He is alert.  Skin: Skin is warm and dry.  Psychiatric: He has a normal mood and affect. His behavior is normal. Thought content normal.  Nursing note and vitals reviewed.    ED Treatments / Results  Labs (all labs ordered are listed, but only abnormal results are displayed) Labs Reviewed  URINE CULTURE - Abnormal; Notable for the following components:      Result Value   Culture   (*)    Value: 50,000 COLONIES/mL KLEBSIELLA PNEUMONIAE >=100,000 COLONIES/mL ESCHERICHIA COLI    All other components within normal limits  COMPREHENSIVE METABOLIC PANEL - Abnormal; Notable for the following components:   CO2 20 (*)    Glucose, Bld 163 (*)    Creatinine, Ser 1.25 (*)    Albumin 3.3 (*)    ALT 16 (*)    GFR calc non Af Amer 51 (*)    GFR calc Af Amer 60 (*)    All other components within normal limits  CBC - Abnormal; Notable for the following components:   WBC 11.2 (*)    RBC 3.82 (*)    Hemoglobin 11.0 (*)    HCT 33.6 (*)    All other components within normal limits  URINALYSIS, ROUTINE W REFLEX MICROSCOPIC - Abnormal; Notable for the following components:   APPearance CLOUDY (*)    Hgb  urine dipstick TRACE (*)    Protein, ur 30 (*)    Nitrite POSITIVE (*)    Leukocytes, UA MODERATE (*)    All other components within normal limits  URINALYSIS, MICROSCOPIC (REFLEX) - Abnormal; Notable for the following components:   Bacteria, UA MANY (*)    Squamous Epithelial / LPF 0-5 (*)    All other components within normal limits  LIPASE, BLOOD    EKG  EKG Interpretation  Date/Time:  Monday August 08 2017 09:54:23 EST Ventricular Rate:  48 PR Interval:    QRS Duration: 108 QT Interval:  510 QTC Calculation: 456 R Axis:   71 Text Interpretation:  Sinus bradycardia Non-specific ST-t changes Confirmed by Raeford Razor [phone]) on 08/08/2017 10:29:49 AM       Radiology No results found.  Procedures Procedures (including critical care time)  Medications Ordered in ED Medications  ondansetron (ZOFRAN) injection 4 mg (not administered)     Initial Impression / Assessment and Plan / ED Course  I have reviewed the triage vital signs and the nursing notes.  Pertinent labs & imaging results that were available during my care of the patient were reviewed by me and considered in my medical decision making (see chart for details).    83 year old male with nausea and vomiting since yesterday.  Denies any acute pain.  His abdominal exam is benign.  He is afebrile.  He generally appears well.  Some bradycardia noted but he does not seem to be overtly symptomatic with it.  We will treat his symptoms.  P.o. challenge.  Assuming his symptoms can be reasonably controlled and basic labs do not show any marketed acute abnormality, I feel that he can be discharged with continued symptomatic treatment.  I doubt emergent process.  He is feeling better.  He actually has a urinary tract infection.  He received a dose of Rocephin in the emergency room.  He is anxious to go home.  I think it is reasonable/appropriate to treat this as an outpatient.  Return precautions were discussed.  Final  Clinical Impressions(s) / ED Diagnoses   Final diagnoses:  Nausea and vomiting, intractability  of vomiting not specified, unspecified vomiting type  Urinary tract infection without hematuria, site unspecified    ED Discharge Orders    None       Raeford RazorKohut, Nailyn Dearinger, MD 08/10/17 1610

## 2017-08-08 NOTE — ED Notes (Signed)
Pt given water for fluid challenge 

## 2017-08-10 ENCOUNTER — Ambulatory Visit: Payer: Medicare Other | Admitting: Physician Assistant

## 2017-08-11 LAB — URINE CULTURE: Culture: 50000 — AB

## 2017-08-12 ENCOUNTER — Telehealth: Payer: Self-pay

## 2017-08-12 MED FILL — Sodium Bicarbonate IV Soln 8.4%: INTRAVENOUS | Qty: 50 | Status: AC

## 2017-08-12 MED FILL — Mannitol IV Soln 20%: INTRAVENOUS | Qty: 500 | Status: AC

## 2017-08-12 MED FILL — Sodium Chloride IV Soln 0.9%: INTRAVENOUS | Qty: 2000 | Status: AC

## 2017-08-12 MED FILL — Heparin Sodium (Porcine) Inj 1000 Unit/ML: INTRAMUSCULAR | Qty: 10 | Status: AC

## 2017-08-12 MED FILL — Electrolyte-R (PH 7.4) Solution: INTRAVENOUS | Qty: 3000 | Status: AC

## 2017-08-12 MED FILL — Lidocaine HCl IV Inj 20 MG/ML: INTRAVENOUS | Qty: 5 | Status: AC

## 2017-08-12 NOTE — Telephone Encounter (Signed)
Post ED Visit - Positive Culture Follow-up  Culture report reviewed by antimicrobial stewardship pharmacist:  []  Enzo BiNathan Batchelder, Pharm.D. []  Celedonio MiyamotoJeremy Frens, Pharm.D., BCPS AQ-ID [x]  Garvin FilaMike Maccia, Pharm.D., BCPS []  Georgina PillionElizabeth Martin, Pharm.D., BCPS []  PetersburgMinh Pham, [address]Pharm.D., BCPS, AAHIVP []  Estella HuskMichelle Turner, Pharm.D., BCPS, AAHIVP []  Lysle Pearlachel Rumbarger, PharmD, BCPS []  Blake DivineShannon Parkey, PharmD []  Pollyann SamplesAndy Johnston, PharmD, BCPS  Positive urine culture Treated with Cephalexin, organism sensitive to the same and no further patient follow-up is required at this time.  Jerry CarasCullom, Mickal Meno Burnett 08/12/2017, 9:13 AM

## 2017-08-15 ENCOUNTER — Ambulatory Visit: Payer: Medicare Other | Admitting: Cardiology

## 2017-08-16 ENCOUNTER — Ambulatory Visit: Payer: Medicare Other | Admitting: Cardiology

## 2017-08-16 ENCOUNTER — Other Ambulatory Visit: Payer: Self-pay | Admitting: Surgery

## 2017-08-16 ENCOUNTER — Encounter: Payer: Self-pay | Admitting: Cardiology

## 2017-08-16 VITALS — BP 112/52 | HR 64 | Ht 70.0 in | Wt 212.2 lb

## 2017-08-16 DIAGNOSIS — I25110 Atherosclerotic heart disease of native coronary artery with unstable angina pectoris: Secondary | ICD-10-CM

## 2017-08-16 DIAGNOSIS — I10 Essential (primary) hypertension: Secondary | ICD-10-CM | POA: Diagnosis not present

## 2017-08-16 DIAGNOSIS — Z951 Presence of aortocoronary bypass graft: Secondary | ICD-10-CM | POA: Diagnosis not present

## 2017-08-16 NOTE — Patient Instructions (Addendum)
Your physician has recommended you make the following change in your medication:  STOP AMIODARONE  Your physician recommends that you schedule a follow-up appointment in:  3-4 MONTHS WITH DR Cody Rivers   Heart-Healthy Eating Plan Heart-healthy meal planning includes:  Limiting unhealthy fats.  Increasing healthy fats.  Making other small dietary changes.  You may need to talk with your doctor or a diet specialist (dietitian) to create an eating plan that is right for you. What types of fat should I choose?  Choose healthy fats. These include olive oil and canola oil, flaxseeds, walnuts, almonds, and seeds.  Eat more omega-3 fats. These include salmon, mackerel, sardines, tuna, flaxseed oil, and ground flaxseeds. Try to eat fish at least twice each week.  Limit saturated fats. ? Saturated fats are often found in animal products, such as meats, butter, and cream. ? Plant sources of saturated fats include palm oil, palm kernel oil, and coconut oil.  Avoid foods with partially hydrogenated oils in them. These include stick margarine, some tub margarines, cookies, crackers, and other baked goods. These contain trans fats. What general guidelines do I need to follow?  Check food labels carefully. Identify foods with trans fats or high amounts of saturated fat.  Fill one half of your plate with vegetables and green salads. Eat 4-5 servings of vegetables per day. A serving of vegetables is: ? 1 cup of raw leafy vegetables. ?  cup of raw or cooked cut-up vegetables. ?  cup of vegetable juice.  Fill one fourth of your plate with whole grains. Look for the word "whole" as the first word in the ingredient list.  Fill one fourth of your plate with lean protein foods.  Eat 4-5 servings of fruit per day. A serving of fruit is: ? One medium whole fruit. ?  cup of dried fruit. ?  cup of fresh, frozen, or canned fruit. ?  cup of 100% fruit juice.  Eat more foods that contain soluble  fiber. These include apples, broccoli, carrots, beans, peas, and barley. Try to get 20-30 g of fiber per day.  Eat more home-cooked food. Eat less restaurant, buffet, and fast food.  Limit or avoid alcohol.  Limit foods high in starch and sugar.  Avoid fried foods.  Avoid frying your food. Try baking, boiling, grilling, or broiling it instead. You can also reduce fat by: ? Removing the skin from poultry. ? Removing all visible fats from meats. ? Skimming the fat off of stews, soups, and gravies before serving them. ? Steaming vegetables in water or broth.  Lose weight if you are overweight.  Eat 4-5 servings of nuts, legumes, and seeds per week: ? One serving of dried beans or legumes equals  cup after being cooked. ? One serving of nuts equals 1 ounces. ? One serving of seeds equals  ounce or one tablespoon.  You may need to keep track of how much salt or sodium you eat. This is especially true if you have high blood pressure. Talk with your doctor or dietitian to get more information. What foods can I eat? Grains Breads, including [place], white, pita, wheat, raisin, rye, oatmeal, and [place]. Tortillas that are neither fried nor made with lard or trans fat. Low-fat rolls, including hotdog and hamburger buns and English muffins. Biscuits. Muffins. Waffles. Pancakes. Light popcorn. Whole-grain cereals. Flatbread. Melba toast. Pretzels. Breadsticks. Rusks. Low-fat snacks. Low-fat crackers, including oyster, saltine, matzo, graham, animal, and rye. Rice and pasta, including brown rice and pastas that are  made with whole wheat. Vegetables All vegetables. Fruits All fruits, but limit coconut. Meats and Other Protein Sources Lean, well-trimmed beef, veal, pork, and lamb. Chicken and [place] without skin. All fish and shellfish. Wild duck, rabbit, pheasant, and venison. Egg whites or low-cholesterol egg substitutes. Dried beans, peas, lentils, and tofu. Seeds and most  nuts. Dairy Low-fat or nonfat cheeses, including ricotta, string, and mozzarella. Skim or 1% milk that is liquid, powdered, or evaporated. Buttermilk that is made with low-fat milk. Nonfat or low-fat yogurt. Beverages Mineral water. Diet carbonated beverages. Sweets and Desserts Sherbets and fruit ices. Honey, jam, marmalade, jelly, and syrups. Meringues and gelatins. Pure sugar candy, such as hard candy, jelly beans, gumdrops, mints, marshmallows, and small amounts of dark chocolate. [employer]. Eat all sweets and desserts in moderation. Fats and Oils Nonhydrogenated (trans-free) margarines. Vegetable oils, including soybean, sesame, sunflower, olive, peanut, safflower, corn, canola, and cottonseed. Salad dressings or mayonnaise made with a vegetable oil. Limit added fats and oils that you use for cooking, baking, salads, and as spreads. Other Cocoa powder. Coffee and tea. All seasonings and condiments. The items listed above may not be a complete list of recommended foods or beverages. Contact your dietitian for more options. What foods are not recommended? Grains Breads that are made with saturated or trans fats, oils, or whole milk. Croissants. Butter rolls. Cheese breads. Sweet rolls. Donuts. Buttered popcorn. Chow mein noodles. High-fat crackers, such as cheese or butter crackers. Meats and Other Protein Sources Fatty meats, such as hotdogs, short ribs, sausage, spareribs, bacon, rib eye roast or steak, and mutton. High-fat deli meats, such as salami and bologna. Caviar. Domestic duck and goose. Organ meats, such as kidney, liver, sweetbreads, and heart. Dairy Cream, sour cream, cream cheese, and creamed cottage cheese. Whole-milk cheeses, including blue (bleu), [address], [place], [place], [address], [place], [address], cheddar, [place], and [place]. Whole or 2% milk that is liquid, evaporated, or condensed. Whole buttermilk. Cream sauce or high-fat cheese sauce. Yogurt that is made from  whole milk. Beverages Regular sodas and juice drinks with added sugar. Sweets and Desserts Frosting. Pudding. Cookies. Cakes other than angel food cake. Candy that has milk chocolate or white chocolate, hydrogenated fat, butter, coconut, or unknown ingredients. Buttered syrups. Full-fat ice cream or ice cream drinks. Fats and Oils Gravy that has suet, meat fat, or shortening. Cocoa butter, hydrogenated oils, palm oil, coconut oil, palm kernel oil. These can often be found in baked products, candy, fried foods, nondairy creamers, and whipped toppings. Solid fats and shortenings, including bacon fat, salt pork, lard, and butter. Nondairy cream substitutes, such as coffee creamers and sour cream substitutes. Salad dressings that are made of unknown oils, cheese, or sour cream. The items listed above may not be a complete list of foods and beverages to avoid. Contact your dietitian for more information. This information is not intended to replace advice given to you by your health care provider. Make sure you discuss any questions you have with your health care provider. Document Released: 01/25/2012 Document Revised: 01/01/2016 Document Reviewed: 01/17/2014 Elsevier Interactive Patient Education  2018 [employer].    Your physician recommends that you schedule a follow-up appointment in:

## 2017-08-16 NOTE — Progress Notes (Signed)
08/16/2017 Cody Rivers   1931-12-02  [id number]  Primary Physician Cody Rua, MD Primary Cardiologist: Dr. Excell Seltzer   Reason for Visit/CC: [facility] f/u for CAD s/p CABG  HPI:  Cody Rivers is a 84 y.o. male who is being seen today for post hospital f/u for CAD s/p CABG.   He has a remote h/o atrial flutter s/p aflutter ablation several years ago. Also a h/o HLD and DM. He recently presented to [facility] with complaint of chest pain. He ruled out for MI but underwent a NST that was abnormal. Subsequently, he underwent a LHC which showed severe multivessel CAD with LM disease. LVEF was normal. On 07/16/17, he underwent CABG x6 by Dr. Laneta Simmers with a LIMA-LAD, RIMA to Diag, SVR-Ramus, sequential SVG to OM1 and OM2 and SVG to PDA. Post op recovery was complicated by atrial fibrillation and he was started on IV amiodarone. He converted to NSR and was transitioned to PO amiodarone. There was no indication for OAC. He was placed on high dose ASA, statin and BB. His ACE-I was discontinued due to soft BP.   He presents to clinic today for post hospital f/u. He is here with his son. He has done well since discharge. No anginal symptoms including no CP or dyspnea. He denies any palpitations. EKG shows NSR. He has gradually been weaned down on his amiodarone, now only taking 200 mg once daily. BP is stable. He reports full med compliance. He has surgical f/u with Dr. Laneta Simmers tomorrow.   Current Meds  Medication Sig  . acetaminophen (TYLENOL) 325 MG tablet Take 2 tablets (650 mg total) by mouth every 6 (six) hours as needed for mild pain.  Marland Kitchen amiodarone (PACERONE) 200 MG tablet Take 1 tablet (200 mg total) by mouth 2 (two) times daily. For 5 days;then take Amiodarone 200 mg by mouth daily thereafter  . aspirin EC 325 MG EC tablet Take 1 tablet (325 mg total) by mouth daily.  Marland Kitchen atorvastatin (LIPITOR) 40 MG tablet Take 1 tablet (40 mg total) by mouth daily at 6 PM.  . calcium carbonate (TUMS - DOSED IN MG  ELEMENTAL CALCIUM) 500 MG chewable tablet Chew 1 tablet by mouth daily.  . cephALEXin (KEFLEX) 500 MG capsule Take 1 capsule (500 mg total) by mouth 3 (three) times daily.  Marland Kitchen docusate sodium (COLACE) 100 MG capsule Take 100 mg by mouth 2 (two) times daily.  Marland Kitchen glyBURIDE-metformin (GLUCOVANCE) 2.5-500 MG tablet Take 1 tablet by mouth daily with breakfast.  . Hypromellose (ARTIFICIAL TEARS OP) Place 1 drop into both eyes daily as needed (dry eyes).  Marland Kitchen ketoconazole (NIZORAL) 2 % cream Apply 1 application topically daily. To foot rash  . metoprolol tartrate (LOPRESSOR) 25 MG tablet Take 1 tablet (25 mg total) by mouth 2 (two) times daily.  . Multiple Vitamin (MULTIVITAMIN) tablet Take 1 tablet by mouth daily.  . Multiple Vitamins-Minerals (PRESERVISION AREDS PO) Take 1 tablet by mouth daily.  . ondansetron (ZOFRAN) 4 MG tablet Take 1 tablet (4 mg total) by mouth 4 (four) times daily as needed for nausea or vomiting.  Marland Kitchen oxybutynin (DITROPAN) 5 MG tablet Take 5 mg by mouth 3 (three) times daily.  . tamsulosin (FLOMAX) 0.4 MG CAPS capsule Take 0.4 mg by mouth daily.  . traMADol (ULTRAM) 50 MG tablet Take 1 tablet (50 mg total) by mouth every 6 (six) hours as needed for severe pain.  . vitamin A 40981 UNIT capsule Take 10,000 Units by mouth daily.  . vitamin  B-12 (CYANOCOBALAMIN) 1000 MCG tablet Take 1,000 mcg by mouth daily.   Allergies  Allergen Reactions  . Robitussin 12 Hour Cough [Dextromethorphan Polistirex Er] Nausea And Vomiting   Past Medical History:  Diagnosis Date  . Arthritis   . Atrial flutter (HCC) 11/20/2008   Qualifier: Diagnosis of  By: Johney FrameAllred, MD, Fayrene FearingJames    . Chest pain with moderate risk for cardiac etiology 07/14/2017  . Coronary artery disease   . Diabetes mellitus without complication (HCC)   . Hyperlipidemia   . Hypertension    Family History  Problem Relation Age of Onset  . Cancer Father   . Diabetes Maternal Grandfather [id number]   Past Surgical History:  Procedure  Laterality Date  . ATRIAL FLUTTER ABLATION  2010   Dr Johney FrameAllred  . CARDIAC SURGERY    . CORONARY ARTERY BYPASS GRAFT N/A 07/16/2017   Procedure: CORONARY ARTERY BYPASS GRAFTING (CABG) TIMES SIX  USING RIGHT AND LEFT  INTERNAL MAMMARY ARTERIES  AND RIGHT AND LEFT  SAPHENOUS VEIN HARVESTED ENDOSCOPICALLY;  Surgeon: Alleen BorneBartle, Bryan K, MD;  Location: [facility] OR;  Service: Open Heart Surgery;  Laterality: N/A;  . LEFT HEART CATH AND CORONARY ANGIOGRAPHY N/A 07/15/2017   Procedure: LEFT HEART CATH AND CORONARY ANGIOGRAPHY;  Surgeon: Tonny Bollmanooper, Michael, MD;  [facility];  Service: Cardiovascular;  Laterality: N/A;  . REPLACEMENT TOTAL KNEE BILATERAL    . ROTATOR CUFF REPAIR     LEFT  . TEE WITHOUT CARDIOVERSION N/A 07/16/2017   Procedure: TRANSESOPHAGEAL ECHOCARDIOGRAM (TEE);  Surgeon: Alleen BorneBartle, Bryan K, MD;  [facility];  Service: Open Heart Surgery;  Laterality: N/A;   Social History   Socioeconomic History  . Marital status: Widowed    Spouse name: Not on file  . Number of children: Not on file  . Years of education: Not on file  . Highest education level: Not on file  Social Needs  . Financial resource strain: Not on file  . Food insecurity - worry: Not on file  . Food insecurity - inability: Not on file  . Transportation needs - medical: Not on file  . Transportation needs - non-medical: Not on file  Occupational History  . Occupation: retired  Tobacco Use  . Smoking status: Never Smoker  . Smokeless tobacco: Never Used  Substance and Sexual Activity  . Alcohol use: Yes  . Drug use: No  . Sexual activity: No  Other Topics Concern  . Not on file  Social History Narrative   Pt lives with his son.      Review of Systems: General: negative for chills, fever, night sweats or weight changes.  Cardiovascular: negative for chest pain, dyspnea on exertion, edema, orthopnea, palpitations, paroxysmal nocturnal dyspnea or shortness of breath Dermatological: negative for  rash Respiratory: negative for cough or wheezing Urologic: negative for hematuria Abdominal: negative for nausea, vomiting, diarrhea, bright red blood per rectum, melena, or hematemesis Neurologic: negative for visual changes, syncope, or dizziness All other systems reviewed and are otherwise negative except as noted above.   Physical Exam:  Blood pressure (!) 112/52, pulse 64, height 5\' 10"  (1.778 m), weight 212 lb 3.2 oz (96.3 kg), SpO2 97 %.  General appearance: alert, cooperative and no distress Neck: no carotid bruit and no JVD Lungs: clear to auscultation bilaterally Heart: regular rate and rhythm, S1, S2 normal, no murmur, click, rub or gallop Extremities: extremities normal, atraumatic, no cyanosis or edema Pulses: 2+ and symmetric Skin: Skin color, texture, turgor normal. No rashes  or lesions Neurologic: Grossly normal  EKG NSR -- personally reviewed   LHC 07/15/17 Procedures   LEFT HEART CATH AND CORONARY ANGIOGRAPHY  Conclusion   1. Severe left main and multivessel CAD with severe multilevel LAD/diagonal stenosis, severe diffuse RCA stenosis, and severe LCx/OM stenosis 2. Normal LV systolic function 3. Hypotension, resolved with dopamine and IABP insertion     CABG 07/16/17 Preoperative Diagnosis:  Severe left main and multi-vessel coronary artery disease   Postoperative Diagnosis:  Same   Procedure:  1. Median Sternotomy 2. Extracorporeal circulation 3.   Coronary artery bypass grafting x 5   Left internal mammary artery graft to the LAD  Right internal mammary artery graft to the diagonal  SVG to Ramus  Sequential SVG to OM1 and OM2       SVG to PDA   4.   Endoscopic vein harvest from the right and left legs     ASSESSMENT AND PLAN:   1. CAD: s/p CABG x 6 07/16/17 as outlined above. Stable w/ angina. Continue medical therapy for secondary prevention. ASA, stain and BB. I've recommended pt start cardiac rehab once cleared by CT surgery.    2. Post-operative Atrial Fibrillation: NSR on EKG. He denies any breakthrough symptoms. I think we can discontinue PO amiodarone at this point, but I would like for him to discuss this possibility with Dr. Laneta Simmers at his f/u visit with him tomorrow.   3. H/o Atrial Flutter: s/p aflutter ablation in the past.   4. DM: followed by PCP.   5. Lipids: LDL is controlled at 59 mg/dL. Continue statin therapy with Lipitor.    Follow-Up w/ Dr. Excell Seltzer in 3 months.   Brittainy Delmer Islam, MHS [facility] 08/16/2017 12:36 PM

## 2017-08-17 ENCOUNTER — Encounter: Payer: Self-pay | Admitting: Surgery

## 2017-08-17 ENCOUNTER — Other Ambulatory Visit: Payer: Self-pay

## 2017-08-17 ENCOUNTER — Ambulatory Visit (INDEPENDENT_AMBULATORY_CARE_PROVIDER_SITE_OTHER): Payer: Self-pay | Admitting: Surgery

## 2017-08-17 ENCOUNTER — Ambulatory Visit
Admission: RE | Admit: 2017-08-17 | Discharge: 2017-08-17 | Disposition: A | Discharge status: Home / Self Care | Payer: Medicare Other | Source: Ambulatory Visit | Attending: Surgery | Admitting: Surgery

## 2017-08-17 VITALS — BP 103/64 | HR 55 | Ht 70.0 in | Wt 211.4 lb

## 2017-08-17 DIAGNOSIS — Z951 Presence of aortocoronary bypass graft: Secondary | ICD-10-CM

## 2017-08-18 ENCOUNTER — Encounter: Payer: Self-pay | Admitting: Surgery

## 2017-08-18 NOTE — Progress Notes (Signed)
HPI: Patient returns for routine postoperative follow-up having undergone CABG x 5 on 07/17/2018. The patient's early postoperative recovery while in the hospital was notable for development of postoperative atrial fibrillation converted with amiodarone. Since hospital discharge the patient reports that he has been feeling well.  He is walking without chest pain or shortness of breath.  He has not noticed any palpitations or fast heart rate..   Current Outpatient Medications  Medication Sig Dispense Refill  . acetaminophen (TYLENOL) 325 MG tablet Take 2 tablets (650 mg total) by mouth every 6 (six) hours as needed for mild pain.    Marland Kitchen amiodarone (PACERONE) 200 MG tablet Take 1 tablet (200 mg total) by mouth 2 (two) times daily. For 5 days;then take Amiodarone 200 mg by mouth daily thereafter 60 tablet 1  . aspirin EC 325 MG EC tablet Take 1 tablet (325 mg total) by mouth daily. 30 tablet 0  . atorvastatin (LIPITOR) 40 MG tablet Take 1 tablet (40 mg total) by mouth daily at 6 PM. 30 tablet 1  . calcium carbonate (TUMS - DOSED IN MG ELEMENTAL CALCIUM) 500 MG chewable tablet Chew 1 tablet by mouth daily.    . cephALEXin (KEFLEX) 500 MG capsule Take 1 capsule (500 mg total) by mouth 3 (three) times daily. 15 capsule 0  . docusate sodium (COLACE) 100 MG capsule Take 100 mg by mouth 2 (two) times daily.    Marland Kitchen glyBURIDE-metformin (GLUCOVANCE) 2.5-500 MG tablet Take 1 tablet by mouth daily with breakfast.    . Hypromellose (ARTIFICIAL TEARS OP) Place 1 drop into both eyes daily as needed (dry eyes).    Marland Kitchen ketoconazole (NIZORAL) 2 % cream Apply 1 application topically daily. To foot rash  0  . metoprolol tartrate (LOPRESSOR) 25 MG tablet Take 1 tablet (25 mg total) by mouth 2 (two) times daily. 60 tablet 1  . Multiple Vitamin (MULTIVITAMIN) tablet Take 1 tablet by mouth daily.    . Multiple Vitamins-Minerals (PRESERVISION AREDS PO) Take 1 tablet by mouth daily.    . ondansetron (ZOFRAN) 4 MG tablet  Take 1 tablet (4 mg total) by mouth 4 (four) times daily as needed for nausea or vomiting. 12 tablet 0  . oxybutynin (DITROPAN) 5 MG tablet Take 5 mg by mouth 3 (three) times daily.    . tamsulosin (FLOMAX) 0.4 MG CAPS capsule Take 0.4 mg by mouth daily.    . traMADol (ULTRAM) 50 MG tablet Take 1 tablet (50 mg total) by mouth every 6 (six) hours as needed for severe pain. 30 tablet 0  . vitamin A 16109 UNIT capsule Take 10,000 Units by mouth daily.    . vitamin B-12 (CYANOCOBALAMIN) 1000 MCG tablet Take 1,000 mcg by mouth daily.     No current facility-administered medications for this visit.     Physical Exam: BP 103/64 (BP Location: Left Arm, Patient Position: Sitting, Cuff Size: Large)   Pulse (!) 55   Ht 5\' 10"  (1.778 m)   Wt 211 lb 6.4 oz (95.9 kg)   SpO2 99% Comment: RA  BMI 30.33 kg/m  He looks well. Lung exam is clear. Cardiac exam shows a regular rate and rhythm with normal heart sounds. Chest incision is healing well and sternum is stable. The leg incisions are healing well and there is no peripheral edema.    Diagnostic Tests:  CLINICAL DATA:  84 year old male with history of CABG. Follow-up study.  EXAM: CHEST  2 VIEW  COMPARISON:  Chest x-ray 07/18/2017.  FINDINGS: Previously noted right internal jugular Cordis has been removed. Median sternotomy wires are again noted. Small left pleural effusion. No right pleural effusion. No definite consolidative airspace disease. No evidence of pulmonary edema. No pneumothorax. Heart size is normal. Upper mediastinal contours are within normal limits. Aortic atherosclerosis.  IMPRESSION: 1. Small left pleural effusion persists. 2. Aortic atherosclerosis.   Electronically Signed   By: Trudie Reedaniel  Entrikin M.D.   On: 08/17/2017 14:19      Impression:  Overall I think he is doing well. I encouraged him to continue walking. He is planning to participate in cardiac rehab and I think that can start.  He appears  to be maintaining sinus rhythm and I told him he could stop the amiodarone.  I told him he could drive his car but should not lift anything heavier than 10 lbs for three months postop.   Plan:  He is here with his son today and says that he is going to be moving to [place] in March.  He is planning to follow-up with Dr. Excell Seltzerooper and his PCP prior to that and said that [place].  Alleen BorneBryan K Knoah Nedeau, MD [facility] [phone]

## 2017-08-24 ENCOUNTER — Telehealth: Payer: Self-pay | Admitting: Cardiovascular Disease

## 2017-08-24 MED ORDER — METOPROLOL TARTRATE 25 MG PO TABS: 25.0000 mg | ORAL_TABLET | Freq: Two times a day (BID) | ORAL | 3 refills | Status: DC

## 2017-08-24 MED ORDER — ATORVASTATIN CALCIUM 40 MG PO TABS: 40.0000 mg | ORAL_TABLET | Freq: Every day | ORAL | 3 refills | Status: DC

## 2017-08-24 NOTE — Telephone Encounter (Signed)
Per Henrietta DineKemp, Kathryn A, RN  [facility]       08/24/17 8:12 AM  Please fill.  Pt's medications were sent to pt's pharmacy as requested. Confirmation received.

## 2017-08-24 NOTE — Telephone Encounter (Signed)
Pt's pharmacy [facility] is requesting a refill on Atorvastatin 40 mg tablet and Metoprolol 25 mg tablet. Pt was put on these medications in the hospital. Would you like to refill these medications. Please advise

## 2017-09-13 ENCOUNTER — Telehealth (HOSPITAL_COMMUNITY): Payer: Self-pay | Admitting: Pharmacist

## 2017-09-13 NOTE — Telephone Encounter (Signed)
Cardiac Rehab Medication Review by a Pharmacist  Does the patient  feel that his/her medications are working for him/her?  He is not sure, patient has been severely constipated since this year. Will not go for 5-6 days and that is new for him. Wonders if his medications are causing this.  Has the patient been experiencing any side effects to the medications prescribed?  Yes, constipation, and then itching on whole body constantly. Patient has been using cream to help prevent this. It has been "driving him crazy" from his ankles to chest.  Does the patient measure his/her own blood pressure or blood glucose at home?  Yes, roughly around 130/60. Also checks blood sugar at home, but unsure because his son checks everything. Thinks last reading was 160.  Does the patient have any problems obtaining medications due to transportation or finances?   No, medications are pretty low cost for him.  Understanding of regimen: good Understanding of indications: good Potential of compliance: good   Pharmacist comments: Patient wants to know if he will ever be able to stop taking all of his medications. I told him that he will continue to take some of this medications for life, but some of them may be able to be reduced or removed based on clinical progress and MD discretion.   Donnella Biyler Balinda Heacock, PharmD PGY1 Acute Care Pharmacy Resident Pager: [phone] 09/13/2017 5:54 PM

## 2017-09-18 ENCOUNTER — Other Ambulatory Visit: Payer: Self-pay | Admitting: Physician Assistant

## 2017-09-21 ENCOUNTER — Telehealth (HOSPITAL_COMMUNITY): Payer: Self-pay

## 2017-09-21 NOTE — Telephone Encounter (Signed)
Patients sister called to cancel orientation due to the flu going around the hospital. Tried to reschedule orientation - stated that patient will be moving. Patient will not be doing CR here. Closed referral.

## 2017-09-22 ENCOUNTER — Ambulatory Visit (HOSPITAL_COMMUNITY): Payer: Medicare Other

## 2017-09-26 ENCOUNTER — Ambulatory Visit (HOSPITAL_COMMUNITY): Payer: Medicare Other

## 2017-09-28 ENCOUNTER — Ambulatory Visit (HOSPITAL_COMMUNITY): Payer: Medicare Other

## 2017-09-30 ENCOUNTER — Ambulatory Visit (HOSPITAL_COMMUNITY): Payer: Medicare Other

## 2017-10-03 ENCOUNTER — Ambulatory Visit (HOSPITAL_COMMUNITY): Payer: Medicare Other

## 2017-10-05 ENCOUNTER — Ambulatory Visit (HOSPITAL_COMMUNITY): Payer: Medicare Other

## 2017-10-07 ENCOUNTER — Ambulatory Visit (HOSPITAL_COMMUNITY): Payer: Medicare Other

## 2017-10-10 ENCOUNTER — Ambulatory Visit (HOSPITAL_COMMUNITY): Payer: Medicare Other

## 2017-10-12 ENCOUNTER — Ambulatory Visit (HOSPITAL_COMMUNITY): Payer: Medicare Other

## 2017-10-14 ENCOUNTER — Ambulatory Visit (HOSPITAL_COMMUNITY): Payer: Medicare Other

## 2017-10-17 ENCOUNTER — Ambulatory Visit (HOSPITAL_COMMUNITY): Payer: Medicare Other

## 2017-10-19 ENCOUNTER — Ambulatory Visit (HOSPITAL_COMMUNITY): Payer: Medicare Other

## 2017-10-21 ENCOUNTER — Ambulatory Visit (HOSPITAL_COMMUNITY): Payer: Medicare Other

## 2017-11-21 ENCOUNTER — Ambulatory Visit: Payer: Medicare Other | Admitting: Cardiovascular Disease

## 2017-11-21 ENCOUNTER — Encounter: Payer: Self-pay | Admitting: Cardiovascular Disease

## 2017-11-21 VITALS — BP 124/60 | HR 57 | Ht 70.0 in | Wt 204.0 lb

## 2017-11-21 DIAGNOSIS — Z951 Presence of aortocoronary bypass graft: Secondary | ICD-10-CM

## 2017-11-21 DIAGNOSIS — E782 Mixed hyperlipidemia: Secondary | ICD-10-CM | POA: Diagnosis not present

## 2017-11-21 DIAGNOSIS — I2510 Atherosclerotic heart disease of native coronary artery without angina pectoris: Secondary | ICD-10-CM | POA: Diagnosis not present

## 2017-11-21 MED ORDER — ASPIRIN EC 81 MG PO TBEC: 81.0000 mg | DELAYED_RELEASE_TABLET | Freq: Every day | ORAL | Status: DC

## 2017-11-21 NOTE — Progress Notes (Signed)
Cardiology Office Note Date:  11/21/2017   ID:  Cody Rivers, DOB 08/08/1932, MRN [id number]  PCP:  Joycelyn RuaMeyers, Stephen, MD  Cardiologist:  Tonny BollmanMichael Opha Mcghee, MD    Chief Complaint  Patient presents with  . Follow-up    CAD     History of Present Illness: Cody Rivers is a 84 y.o. male who presents for follow-up of coronary artery disease.  The patient presented in early December 2018 with symptoms of unstable angina.  Prior to that he had no history of documented CAD.  He underwent cardiac catheterization demonstrating severe left main and multivessel coronary artery disease and he ultimately was treated with 6 vessel CABG.  His postoperative course was complicated by atrial fibrillation requiring amiodarone which has been discontinued at this point.  The patient is here with his daughter today.  He is doing quite well.  He denies any chest pain or pressure.  He has mild shortness of breath with activity but nothing that is limiting him.  He denies edema, orthopnea, PND, or heart palpitations.  He is tolerating his medicines well without lightheadedness or syncope.  He plans to spend 6 months of the year in [place] and requests a cardiologist there in case he has problems.   Past Medical History:  Diagnosis Date  . Arthritis   . Atrial flutter (HCC) 11/20/2008   Qualifier: Diagnosis of  By: Johney FrameAllred, MD, Fayrene FearingJames    . Chest pain with moderate risk for cardiac etiology 07/14/2017  . Coronary artery disease   . Diabetes mellitus without complication (HCC)   . Hyperlipidemia   . Hypertension     Past Surgical History:  Procedure Laterality Date  . ATRIAL FLUTTER ABLATION  2010   Dr Johney FrameAllred  . CARDIAC SURGERY    . CORONARY ARTERY BYPASS GRAFT N/A 07/16/2017   Procedure: CORONARY ARTERY BYPASS GRAFTING (CABG) TIMES SIX  USING RIGHT AND LEFT  INTERNAL MAMMARY ARTERIES  AND RIGHT AND LEFT  SAPHENOUS VEIN HARVESTED ENDOSCOPICALLY;  Surgeon: Alleen BorneBartle, Bryan K, MD;  Location: [facility] OR;  Service:  Open Heart Surgery;  Laterality: N/A;  . LEFT HEART CATH AND CORONARY ANGIOGRAPHY N/A 07/15/2017   Procedure: LEFT HEART CATH AND CORONARY ANGIOGRAPHY;  Surgeon: Tonny Bollmanooper, Dessie Delcarlo, MD;  [facility];  Service: Cardiovascular;  Laterality: N/A;  . REPLACEMENT TOTAL KNEE BILATERAL    . ROTATOR CUFF REPAIR     LEFT  . TEE WITHOUT CARDIOVERSION N/A 07/16/2017   Procedure: TRANSESOPHAGEAL ECHOCARDIOGRAM (TEE);  Surgeon: Alleen BorneBartle, Bryan K, MD;  [facility];  Service: Open Heart Surgery;  Laterality: N/A;    Current Outpatient Medications  Medication Sig Dispense Refill  . acetaminophen (TYLENOL) 325 MG tablet Take 2 tablets (650 mg total) by mouth every 6 (six) hours as needed for mild pain.    Marland Kitchen. aspirin 81 MG tablet Take 1 tablet (81 mg total) by mouth daily.    Marland Kitchen. atorvastatin (LIPITOR) 40 MG tablet Take 1 tablet (40 mg total) by mouth daily at 6 PM. 90 tablet 3  . calcium carbonate (TUMS - DOSED IN MG ELEMENTAL CALCIUM) 500 MG chewable tablet Chew 1 tablet by mouth daily.    . cephALEXin (KEFLEX) 500 MG capsule Take 1 capsule (500 mg total) by mouth 3 (three) times daily. 15 capsule 0  . docusate sodium (COLACE) 100 MG capsule Take 100 mg by mouth 2 (two) times daily.    Marland Kitchen. glyBURIDE-metformin (GLUCOVANCE) 2.5-500 MG tablet Take 1 tablet by mouth daily with breakfast.    .  metoprolol tartrate (LOPRESSOR) 25 MG tablet Take 1 tablet (25 mg total) by mouth 2 (two) times daily. 180 tablet 3  . Multiple Vitamin (MULTIVITAMIN) tablet Take 1 tablet by mouth daily.    . Multiple Vitamins-Minerals (PRESERVISION AREDS PO) Take 1 tablet by mouth daily.    Marland Kitchen oxybutynin (DITROPAN) 5 MG tablet Take 5 mg by mouth 3 (three) times daily.    . polyethylene glycol (MIRALAX / GLYCOLAX) packet Take 17 g by mouth daily.    . tamsulosin (FLOMAX) 0.4 MG CAPS capsule Take 0.4 mg by mouth daily.    . vitamin B-12 (CYANOCOBALAMIN) 1000 MCG tablet Take 1,000 mcg by mouth daily.    Marland Kitchen glipiZIDE-metformin  (METAGLIP) 2.5-500 MG tablet Take 2 tablets by mouth.    . levothyroxine (SYNTHROID, LEVOTHROID) 100 MCG tablet Take 1 tablet by mouth once.  0   No current facility-administered medications for this visit.     Allergies:   Robitussin 12 hour cough [dextromethorphan polistirex er]   Social History:  The patient  reports that he has never smoked. He has never used smokeless tobacco. He reports that he drinks alcohol. He reports that he does not use drugs.   Family History:  The patient's family history includes Cancer in his father; Diabetes (age of onset: 23) in his maternal grandfather.    ROS:  Please see the history of present illness.  Otherwise, review of systems is positive for hearing loss, weight loss, depression, muscle pain, easy bruising, excessive fatigue..  All other systems are reviewed and negative.    PHYSICAL EXAM: VS:  BP 124/60   Pulse (!) 57   Ht 5\' 10"  (1.778 m)   Wt 204 lb (92.5 kg)   SpO2 97%   BMI 29.27 kg/m  , BMI Body mass index is 29.27 kg/m. GEN: Well nourished, well developed, in no acute distress  HEENT: normal  Neck: no JVD, no masses. No carotid bruits Cardiac: RRR without murmur or gallop      Chest: Sternotomy scar is well-healed Respiratory:  clear to auscultation bilaterally, normal work of breathing GI: soft, nontender, nondistended, + BS MS: no deformity or atrophy  Ext: no pretibial edema, pedal pulses 2+= bilaterally Skin: warm and dry, no rash Neuro:  Strength and sensation are intact Psych: euthymic mood, full affect  EKG:  EKG is not ordered today.  Recent Labs: 07/20/2017: Magnesium 1.8 07/22/2017: TSH 7.000 08/08/2017: ALT 16; BUN 14; Creatinine, Ser 1.25; Hemoglobin 11.0; Platelets 384; Potassium 3.9; Sodium 139   Lipid Panel     Component Value Date/Time   CHOL 126 07/14/2017 1725   TRIG 188 (H) 07/14/2017 1725   HDL 36 (L) 07/14/2017 1725   CHOLHDL 3.5 07/14/2017 1725   VLDL 38 07/14/2017 1725   LDLCALC 52 07/14/2017  1725      Wt Readings from Last 3 Encounters:  11/21/17 204 lb (92.5 kg)  08/17/17 211 lb 6.4 oz (95.9 kg)  08/16/17 212 lb 3.2 oz (96.3 kg)     Cardiac Studies Reviewed: Cardiac Cath 07-15-2017: Conclusion   1. Severe left main and multivessel CAD with severe multilevel LAD/diagonal stenosis, severe diffuse RCA stenosis, and severe LCx/OM stenosis 2. Normal LV systolic function 3. Hypotension, resolved with dopamine and IABP insertion  Plan: TCTS consultation for consideration for consideration of CABG, tx patient to CCU with IABP for support/coronary perfusion. Discussed plan with patient and his son.   ASSESSMENT AND PLAN: 1.  Coronary artery disease, native vessel, without angina: The  patient is doing well after multivessel CABG in December 2018.  I advised him to decrease his aspirin dose to 81 mg daily.  He otherwise will continue on his current medical program which includes a statin drug and beta-blocker.  I do not think his blood pressure will allow for initiation of an ACE inhibitor or ARB at this point, especially in the context of his advanced age.  2.  Postoperative atrial fibrillation: No recurrence.  He has discontinued amiodarone.  He continues on antiplatelet therapy with aspirin.  3.  Type 2 diabetes: Followed by his primary care physician: Recent A1c of 6.3.  4.  Hyperlipidemia: The patient is treated with a statin drug.  Recent lipids are reviewed with a cholesterol of 103, HDL 48, LDL 39, triglycerides 78.  Current medicines are reviewed with the patient today.  The patient does not have concerns regarding medicines.  Labs/ tests ordered today include:  No orders of the defined types were placed in this encounter.   Disposition:   FU 6 months  Signed, Tonny Bollman, MD  11/21/2017 5:45 PM    [facility] [address] [place], [place], [place]  [address] Phone: [phone]; Fax: [phone]

## 2017-11-21 NOTE — Patient Instructions (Signed)
Medication Instructions:  1) DECREASE ASPIRIN to 81 mg daily  Labwork: None  Testing/Procedures: None  Follow-Up: Your provider wants you to follow-up in: 6 months with Dr. Excell Seltzerooper. You will receive a reminder letter in the mail two months in advance. If you don't receive a letter, please call our office to schedule the follow-up appointment.    Any Other Special Instructions Will Be Listed Below (If Applicable). Names of MDs in [place]: Layla Barterhris Barber Riccardo DubinLance Lewis David Weaver   If you need a refill on your cardiac medications before your next appointment, please call your pharmacy.

## 2018-06-05 ENCOUNTER — Ambulatory Visit: Payer: Medicare Other | Admitting: Physician Assistant

## 2018-06-13 ENCOUNTER — Ambulatory Visit: Payer: Medicare Other | Admitting: Physician Assistant

## 2018-06-27 ENCOUNTER — Ambulatory Visit: Payer: Medicare Other | Admitting: Physician Assistant

## 2018-06-27 ENCOUNTER — Encounter: Payer: Self-pay | Admitting: Physician Assistant

## 2018-06-27 VITALS — BP 140/50 | HR 78 | Ht 70.5 in | Wt 218.8 lb

## 2018-06-27 DIAGNOSIS — R079 Chest pain, unspecified: Secondary | ICD-10-CM

## 2018-06-27 DIAGNOSIS — N390 Urinary tract infection, site not specified: Secondary | ICD-10-CM

## 2018-06-27 DIAGNOSIS — I2510 Atherosclerotic heart disease of native coronary artery without angina pectoris: Secondary | ICD-10-CM

## 2018-06-27 DIAGNOSIS — R319 Hematuria, unspecified: Secondary | ICD-10-CM

## 2018-06-27 DIAGNOSIS — I10 Essential (primary) hypertension: Secondary | ICD-10-CM | POA: Diagnosis not present

## 2018-06-27 DIAGNOSIS — E785 Hyperlipidemia, unspecified: Secondary | ICD-10-CM

## 2018-06-27 DIAGNOSIS — K921 Melena: Secondary | ICD-10-CM

## 2018-06-27 DIAGNOSIS — R0789 Other chest pain: Secondary | ICD-10-CM

## 2018-06-27 LAB — COMPREHENSIVE METABOLIC PANEL
ALK PHOS: 112 IU/L (ref 39–117)
ALT: 23 IU/L (ref 0–44)
AST: 22 IU/L (ref 0–40)
Albumin/Globulin Ratio: 1.7 (ref 1.2–2.2)
Albumin: 4 g/dL (ref 3.5–4.7)
BUN/Creatinine Ratio: 19 (ref 10–24)
BUN: 23 mg/dL (ref 8–27)
Bilirubin Total: 0.3 mg/dL (ref 0.0–1.2)
CO2: 23 mmol/L (ref 20–29)
CREATININE: 1.23 mg/dL (ref 0.76–1.27)
Calcium: 9.7 mg/dL (ref 8.6–10.2)
Chloride: 103 mmol/L (ref 96–106)
GFR calc Af Amer: 62 mL/min/{1.73_m2} (ref >59)
GFR calc non Af Amer: 54 mL/min/{1.73_m2} — ABNORMAL LOW (ref >59)
GLOBULIN, TOTAL: 2.3 g/dL (ref 1.5–4.5)
GLUCOSE: 182 mg/dL — AB (ref 65–99)
Potassium: 4.7 mmol/L (ref 3.5–5.2)
SODIUM: 142 mmol/L (ref 134–144)
Total Protein: 6.3 g/dL (ref 6.0–8.5)

## 2018-06-27 LAB — CBC
HEMATOCRIT: 40.3 % (ref 37.5–51.0)
Hemoglobin: 12.7 g/dL — ABNORMAL LOW (ref 13.0–17.7)
MCH: 27.9 pg (ref 26.6–33.0)
MCHC: 31.5 g/dL (ref 31.5–35.7)
MCV: 89 fL (ref 79–97)
PLATELETS: 213 10*3/uL (ref 150–450)
RBC: 4.55 x10E6/uL (ref 4.14–5.80)
RDW: 13.2 % (ref 12.3–15.4)
WBC: 7.3 10*3/uL (ref 3.4–10.8)

## 2018-06-27 NOTE — Patient Instructions (Signed)
Medication Instructions:  Your physician recommends that you continue on your current medications as directed. Please refer to the Current Medication list given to you today.  If you need a refill on your cardiac medications before your next appointment, please call your pharmacy.   Lab work: TODAY: CBC, CMET If you have labs (blood work) drawn today and your tests are completely normal, you will receive your results only by: Marland Kitchen. MyChart Message (if you have MyChart) OR . A paper copy in the mail If you have any lab test that is abnormal or we need to change your treatment, we will call you to review the results.  Testing/Procedures: Your physician has requested that you have en exercise stress myoview. For further information please visit [email or web address]. Please follow instruction sheet, as given.  Follow-Up: At [facility], you and your health needs are our priority.  As part of our continuing mission to provide you with exceptional heart care, we have created designated Provider Care Teams.  These Care Teams include your primary Cardiologist (physician) and Advanced Practice Providers (APPs -  Physician Assistants and Nurse Practitioners) who all work together to provide you with the care you need, when you need it. You will need a follow up appointment in:  6 months.  Please call our office 2 months in advance to schedule this appointment.  You may see Tonny BollmanMichael Cooper, MD or one of the following Advanced Practice Providers on your designated Care Team: Tereso NewcomerScott Weaver, PA-C Vin HerbstBhagat, [place] JerseyPA-C . Berton BonJanine Hammond, NP  Any Other Special Instructions Will Be Listed Below (If Applicable).

## 2018-06-27 NOTE — Progress Notes (Signed)
Cardiology Office Note:    Date:  06/27/2018   ID:  Cody CullensEdmund Rivers, DOB 05/17/1932, MRN [id number]  PCP:  Joycelyn RuaMeyers, Stephen, MD  Cardiologist:  Tonny BollmanMichael Cooper, MD   Electrophysiologist:  None   Referring MD: Joycelyn RuaMeyers, Stephen, MD   Chief Complaint  Patient presents with  . Follow-up    CAD  . Chest Pain  . Blood in stool, blood in urine     History of Present Illness:    Cody Cullensdmund Tietze is a 84 y.o. male with coronary artery disease s/p CABG in 07/2017, postoperative atrial fibrillation treated with Amiodarone, hypertension, hyperlipidemia, diabetes.  He was last seen by Dr. Excell Seltzerooper in 11/2017.     Mr. Cody SicMancuso returns for follow-up.  He is here with his daughter.  Two weeks ago, he had blood in his urine and went to urgent care.  He was diagnosed with a urinary tract infection.  Culture was positive for E. coli.  He was treated with antibiotics.  Of note, he also had marked hematochezia on a couple of occasions at the same time.  He has not had any further blood in his urine or rectal bleeding.  Of note, he was due for a colonoscopy prior to his CABG.  He did not have any pain with this.  Recently, he has started to notice a numbness or discomfort in his substernal chest.  This is not necessarily related to exertion.  It does remind him of his previous angina, but not as severe.  He denies any associated shortness of breath, nausea, diaphoresis.  He denies syncope, paroxysmal nocturnal dyspnea or lower extremity swelling.  Prior CV studies:   The following studies were reviewed today:  Intraop TEE 07/16/17 Mild conc LVH, EF 55-60, gr 2 plaque in descending aorta  Cardiac Catheterization 07/15/17 1. Severe left main and multivessel CAD with severe multilevel LAD/diagonal stenosis, severe diffuse RCA stenosis, and severe LCx/OM stenosis 2. Normal LV systolic function 3. Hypotension, resolved with dopamine and IABP insertion   Nuclear stress test 07/15/17 1. Area of reversibility involving  the apex is suspicious for inducible ischemia. Fixed defect involving the inferior and inferolateral wall may represent diaphragmatic attenuation, given normal motion in this area. 2. Normal left ventricular wall motion. 3. Left ventricular ejection fraction 54% 4. Non invasive risk stratification*: Intermediate  Past Medical History:  Diagnosis Date  . Arthritis   . Atrial flutter (HCC) 11/20/2008   Qualifier: Diagnosis of  By: Cody FrameAllred, MD, Fayrene FearingJames    . Chest pain with moderate risk for cardiac etiology 07/14/2017  . Coronary artery disease   . Diabetes mellitus without complication (HCC)   . Hyperlipidemia   . Hypertension    Surgical Hx: The patient  has a past surgical history that includes Replacement total knee bilateral; Rotator cuff repair; Atrial flutter ablation (2010); LEFT HEART CATH AND CORONARY ANGIOGRAPHY (N/A, 07/15/2017); Coronary artery bypass graft (N/A, 07/16/2017); TEE without cardioversion (N/A, 07/16/2017); and Cardiac surgery.   Current Medications: Current Meds  Medication Sig  . acetaminophen (TYLENOL) 325 MG tablet Take 2 tablets (650 mg total) by mouth every 6 (six) hours as needed for mild pain.  Marland Kitchen. aspirin 81 MG tablet Take 1 tablet (81 mg total) by mouth daily.  Marland Kitchen. atorvastatin (LIPITOR) 40 MG tablet Take 1 tablet (40 mg total) by mouth daily at 6 PM.  . calcium carbonate (TUMS - DOSED IN MG ELEMENTAL CALCIUM) 500 MG chewable tablet Chew 1 tablet by mouth daily.  . cephALEXin (KEFLEX) 500 MG  capsule Take 1 capsule (500 mg total) by mouth 3 (three) times daily.  Marland Kitchen docusate sodium (COLACE) 100 MG capsule Take 100 mg by mouth 2 (two) times daily.  Marland Kitchen glipiZIDE-metformin (METAGLIP) 2.5-500 MG tablet Take 2 tablets by mouth.  . glyBURIDE-metformin (GLUCOVANCE) 2.5-500 MG tablet Take 1 tablet by mouth daily with breakfast.  . levothyroxine (SYNTHROID, LEVOTHROID) 100 MCG tablet Take 1 tablet by mouth once.  . metoprolol tartrate (LOPRESSOR) 25 MG tablet Take 1 tablet  (25 mg total) by mouth 2 (two) times daily.  . Multiple Vitamin (MULTIVITAMIN) tablet Take 1 tablet by mouth daily.  . Multiple Vitamins-Minerals (PRESERVISION AREDS PO) Take 1 tablet by mouth daily.  Marland Kitchen oxybutynin (DITROPAN) 5 MG tablet Take 5 mg by mouth 3 (three) times daily.  . polyethylene glycol (MIRALAX / GLYCOLAX) packet Take 17 g by mouth daily.  . tamsulosin (FLOMAX) 0.4 MG CAPS capsule Take 0.4 mg by mouth daily.  . vitamin B-12 (CYANOCOBALAMIN) 1000 MCG tablet Take 1,000 mcg by mouth daily.     Allergies:   Robitussin 12 hour cough [dextromethorphan polistirex er]   Social History   Tobacco Use  . Smoking status: Never Smoker  . Smokeless tobacco: Never Used  Substance Use Topics  . Alcohol use: Yes  . Drug use: No     Family Hx: The patient's family history includes Cancer in his father; Diabetes (age of onset: 50) in his maternal grandfather.  ROS:   Please see the history of present illness.    Review of Systems  Hematologic/Lymphatic: Bruises/bleeds easily.  Gastrointestinal: Positive for constipation and hematochezia.  Genitourinary: Positive for hematuria.  Neurological: Positive for headaches and loss of balance.  Psychiatric/Behavioral: Positive for depression.   All other systems reviewed and are negative.   EKGs/Labs/Other Test Reviewed:    EKG:  EKG is  ordered today.  The ekg ordered today demonstrates normal sinus rhythm, heart rate 61, normal axis, nonspecific ST-T wave changes, QTC 406  Recent Labs: 07/20/2017: Magnesium 1.8 07/22/2017: TSH 7.000 08/08/2017: ALT 16; BUN 14; Creatinine, Ser 1.25; Hemoglobin 11.0; Platelets 384; Potassium 3.9; Sodium 139   Recent Lipid Panel Lab Results  Component Value Date/Time   CHOL 126 07/14/2017 05:25 PM   TRIG 188 (H) 07/14/2017 05:25 PM   HDL 36 (L) 07/14/2017 05:25 PM   CHOLHDL 3.5 07/14/2017 05:25 PM   LDLCALC 52 07/14/2017 05:25 PM   From KPN Tool: Cholesterol, total 103.000 09/27/2017 HDL  48.000 09/27/2017 LDL 16.109 09/27/2017 Triglycerides 78.000 09/27/2017 A1C 6.700 02/20/2018 Hemoglobin 13.100 02/20/2018 Creatinine, Serum 1.080 02/20/2018 Potassium 4.200 02/20/2018 ALT (SGPT) 21.000 02/20/2018 TSH 0.430 02/20/2018 INR 1.300 07/16/2017 Platelets 384.000 08/08/2017  Physical Exam:    VS:  BP (!) 140/50   Pulse 78   Ht 5' 10.5" (1.791 m)   Wt 218 lb 12.8 oz (99.2 kg)   SpO2 98%   BMI 30.95 kg/m     Wt Readings from Last 3 Encounters:  06/27/18 218 lb 12.8 oz (99.2 kg)  11/21/17 204 lb (92.5 kg)  08/17/17 211 lb 6.4 oz (95.9 kg)     Physical Exam  Constitutional: He is oriented to person, place, and time. He appears well-developed and well-nourished. No distress.  HENT:  Head: Normocephalic and atraumatic.  Eyes: No scleral icterus.  Neck: No JVD present. No thyromegaly present.  Cardiovascular: Normal rate and regular rhythm.  No murmur heard. Pulmonary/Chest: Effort normal. He has no rales.  Abdominal: Soft.  Musculoskeletal: He exhibits no edema.  Lymphadenopathy:  He has no cervical adenopathy.  Neurological: He is alert and oriented to person, place, and time.  Skin: Skin is warm and dry.  Psychiatric: He has a normal mood and affect.    ASSESSMENT & PLAN:    Other chest pain His symptoms are somewhat atypical.  However, they do remind him of his previous angina.  His ECG does not demonstrate any significant changes.  He does need further evaluation for his hematochezia.  I have recommended that we proceed with an exercise Myoview to rule out significant ischemia.  Coronary artery disease involving native coronary artery of native heart without angina pectoris History of CABG in 2018.  Continue aspirin, atorvastatin, metoprolol.  Proceed with stress testing as noted.  Of note, he plans to move to [place] permanently.  If he is still coming to [place] 6 months from now, I will have him follow-up with Dr. Excell Seltzer.  Essential hypertension Blood  pressure somewhat elevated today.  He did have a high salt meal.  Continue to monitor for now.  Hyperlipidemia, unspecified hyperlipidemia type LDL optimal on most recent lab work.  Continue current Rx.  Obtain follow-up CMET.  Hematochezia I have advised him to obtain evaluation with his primary care provider as soon as possible.  If he has recurrent hematochezia, he can hold his aspirin.  Obtain CBC today.  Urinary tract infection with hematuria, site unspecified He was treated for UTI.  He has completed antibiotics.  He has not seen any further bleeding.  As noted, I have recommended he follow-up with primary care.   Dispo:  Return in about 6 months (around 12/26/2018) for Routine Follow Up, w/ Dr. Excell Seltzer.   Medication Adjustments/Labs and Tests Ordered: Current medicines are reviewed at length with the patient today.  Concerns regarding medicines are outlined above.  Tests Ordered: Orders Placed This Encounter  Procedures  . Comprehensive metabolic panel  . CBC  . MYOCARDIAL PERFUSION IMAGING  . EKG 12-Lead   Medication Changes: No orders of the defined types were placed in this encounter.   Signed, Tereso Newcomer, PA-C  06/27/2018 12:23 PM    [facility] [address] [place], [place], [place]  [address] Phone: [phone]; Fax: [phone]

## 2018-06-29 ENCOUNTER — Telehealth: Payer: Self-pay | Admitting: Cardiovascular Disease

## 2018-06-29 NOTE — Telephone Encounter (Signed)
New Message   Patients daughter Angelique BlonderDenise is calling to request a referral for the patient to have stress [place]. Please call to discuss a voicemail can be left.

## 2018-06-30 NOTE — Telephone Encounter (Signed)
Spoke with patient Son, Cody Rivers (pt gave verbal permission to speak with son) no DPR on file who states that his sister Cody Rivers will call our office back to let us know where the pt wants to have stress test done in [place].

## 2018-06-30 NOTE — Telephone Encounter (Signed)
LMOM for pt to call back about results and to discuss stress test to be done in [place].

## 2018-07-03 ENCOUNTER — Telehealth: Payer: Self-pay | Admitting: Cardiovascular Disease

## 2018-07-03 NOTE — Telephone Encounter (Signed)
Patient's daughter's called stating her father will be [facility]. She wants to know if Dr. Excell Seltzerooper has any recommendation as to where he can go to get it done.

## 2018-07-04 NOTE — Telephone Encounter (Signed)
-  Spoke with pt daughter, Angelique BlonderDenise and made aware of new appt for Myoview exercise test at [facility] [address] [place], [place] [address]on 12/4 @ 10am but pt will need to be there at 8am.  -Pt daughter wanted to know the names of the providers in [place] that Dr. Excell Seltzerooper gave her at last appt on 4/15. I provider her with the names listed from last office note (4/15): Layla Barterhris Barber, Frederico HammanLance Lewis, Octavio Mannsavid Weaver.  -Reviewed results about labs (refer to result note 11/19) with Angelique Blonderenise per Tereso NewcomerScott Weaver, PA-C. She stated that pt just f/u with PCP yesterday (11/25). -Pt daughter verbalized understanding and thanked me for the call.

## 2018-07-04 NOTE — Telephone Encounter (Signed)
Per Tereso NewcomerScott Weaver PA-C to call [facility] to schedule Myoview exercise to be done. Spoke with Lamar LaundrySonya in Radiology from [facility] [phone] and appointment schedule for 12/4 @ 10 am. Lamar LaundrySonya will fax over forms that need to filled out by Tereso NewcomerScott Weaver, PA-C and faxed back to [phone]. Pt appt for [facility] has been cancelled. Will call pt daughter, Angelique BlonderDenise to inform her and pt of new appt.

## 2018-07-05 NOTE — Telephone Encounter (Signed)
New message     Cody Rivers is calling to have orders faxed over for pt stress test. Pt is scheduled for Wednesday and they do not have an order. Fax-[phone]

## 2018-07-05 NOTE — Telephone Encounter (Signed)
Called Sonya and asked if she could resend fax. Lamar LaundrySonya stated she would send it right now. Received form. Tereso NewcomerScott Weaver PA filled out form. Faxed form back to number provided.

## 2018-07-12 ENCOUNTER — Encounter (HOSPITAL_COMMUNITY): Payer: Medicare Other

## 2018-07-17 NOTE — Telephone Encounter (Signed)
Please call [facility] to obtain a copy of the Nuclear stress test results done last week. Tereso NewcomerScott Weaver, PA-C    07/17/2018 5:10 PM

## 2018-07-19 NOTE — Telephone Encounter (Signed)
Manual fax [phone]) has been sent over to [facility] to receive Nuclear stress test results.

## 2018-07-26 ENCOUNTER — Telehealth: Payer: Self-pay

## 2018-07-26 NOTE — Telephone Encounter (Signed)
Spoke with pt's daughter Angelique BlonderDenise about stress test that was suppose to be done at [facility]. Pt's daughter states that pt couldn't get test done due to pt's insurance not covering to be test [facility]. Pt is going to switch insurance soon [facility] and pt's daughter will call [facility] to schedule test when switch happens. Pt's daughter states she will call our office when a date is schedule.

## 2018-07-31 ENCOUNTER — Other Ambulatory Visit: Payer: Self-pay | Admitting: Cardiovascular Disease

## 2019-04-28 IMAGING — DX DG CHEST 1V PORT
1 series · 1 of 1 positions shown · non-contrast
Comparison: Chest radiograph 07/14/2017

CLINICAL DATA: Pre op

EXAM:
PORTABLE CHEST 1 VIEW

[chest ap]
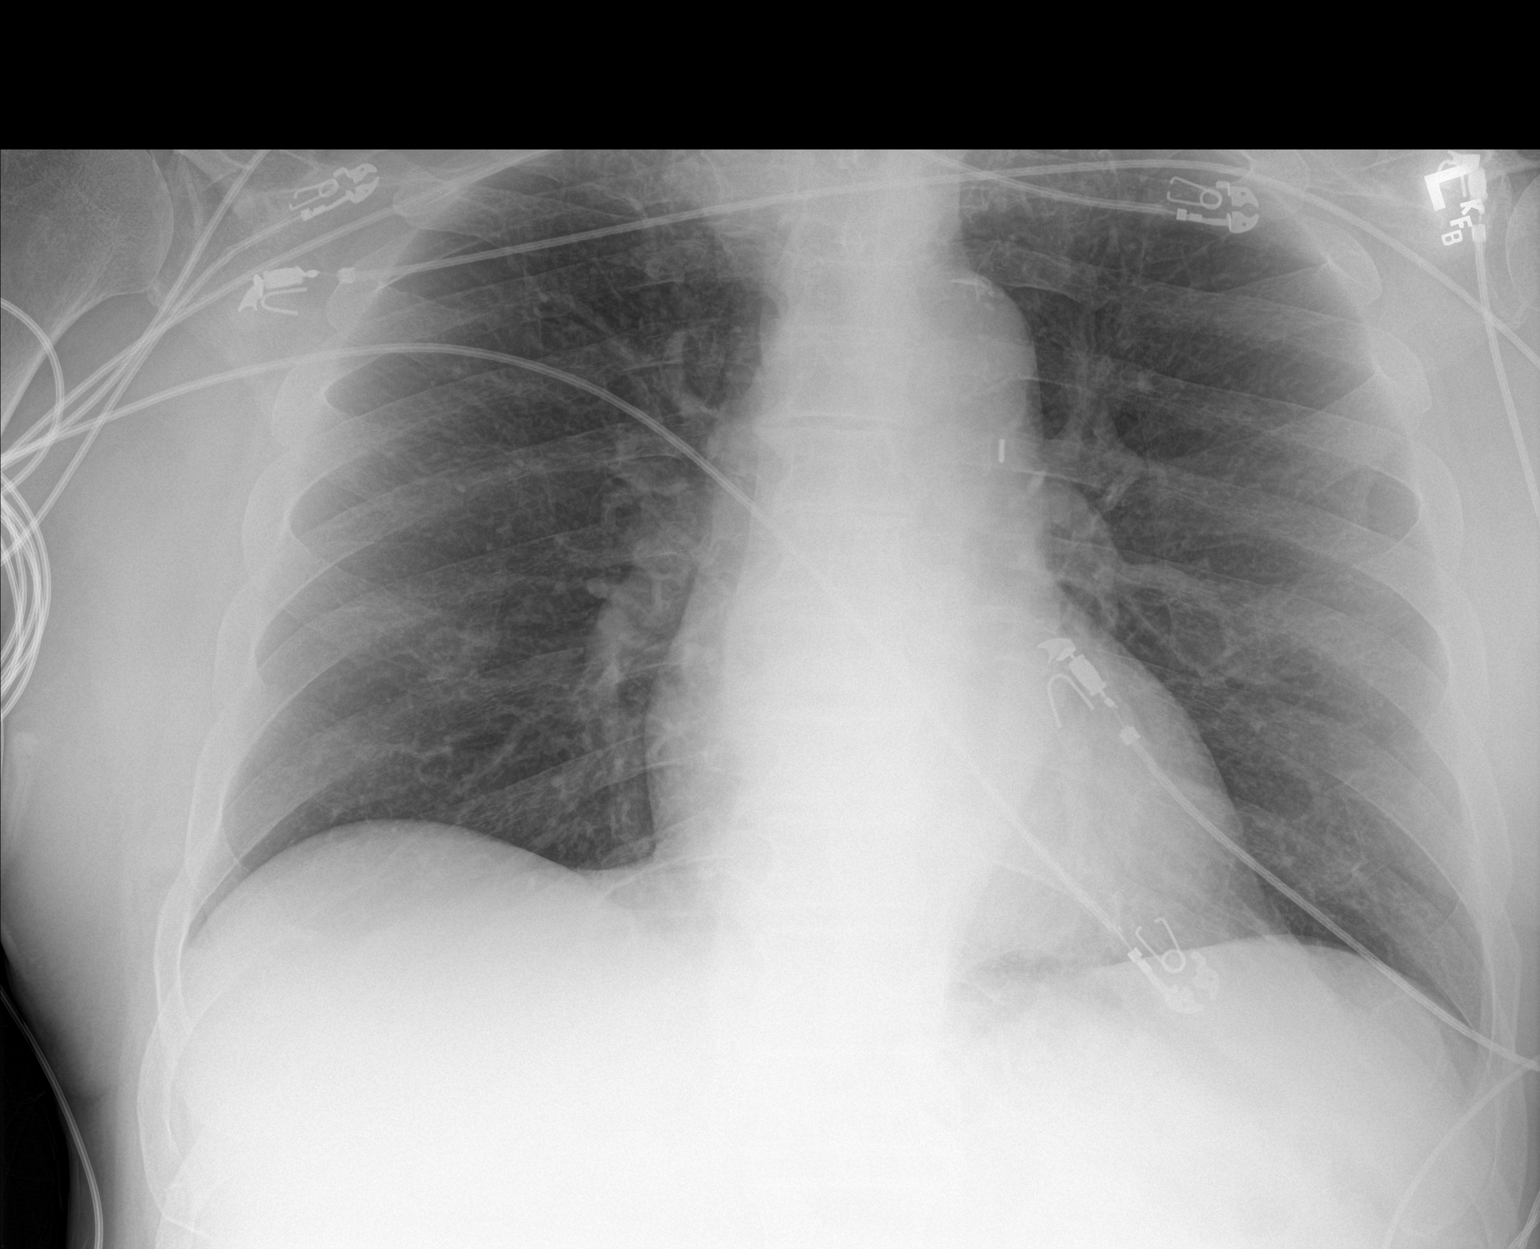

[1 of 1 positions shown; findings below may reference images not displayed]

FINDINGS: Normal mediastinum and cardiac silhouette. Normal pulmonary
vasculature. No evidence of effusion, infiltrate, or pneumothorax.
No acute bony abnormality.
IMPRESSION: Normal chest radiograph

## 2019-04-28 IMAGING — DX DG CHEST 1V PORT
1 series · 1 of 1 positions shown · non-contrast
Comparison: 07/16/2017

CLINICAL DATA: Status post coronary bypass grafting

EXAM:
PORTABLE CHEST 1 VIEW

[chest ap]
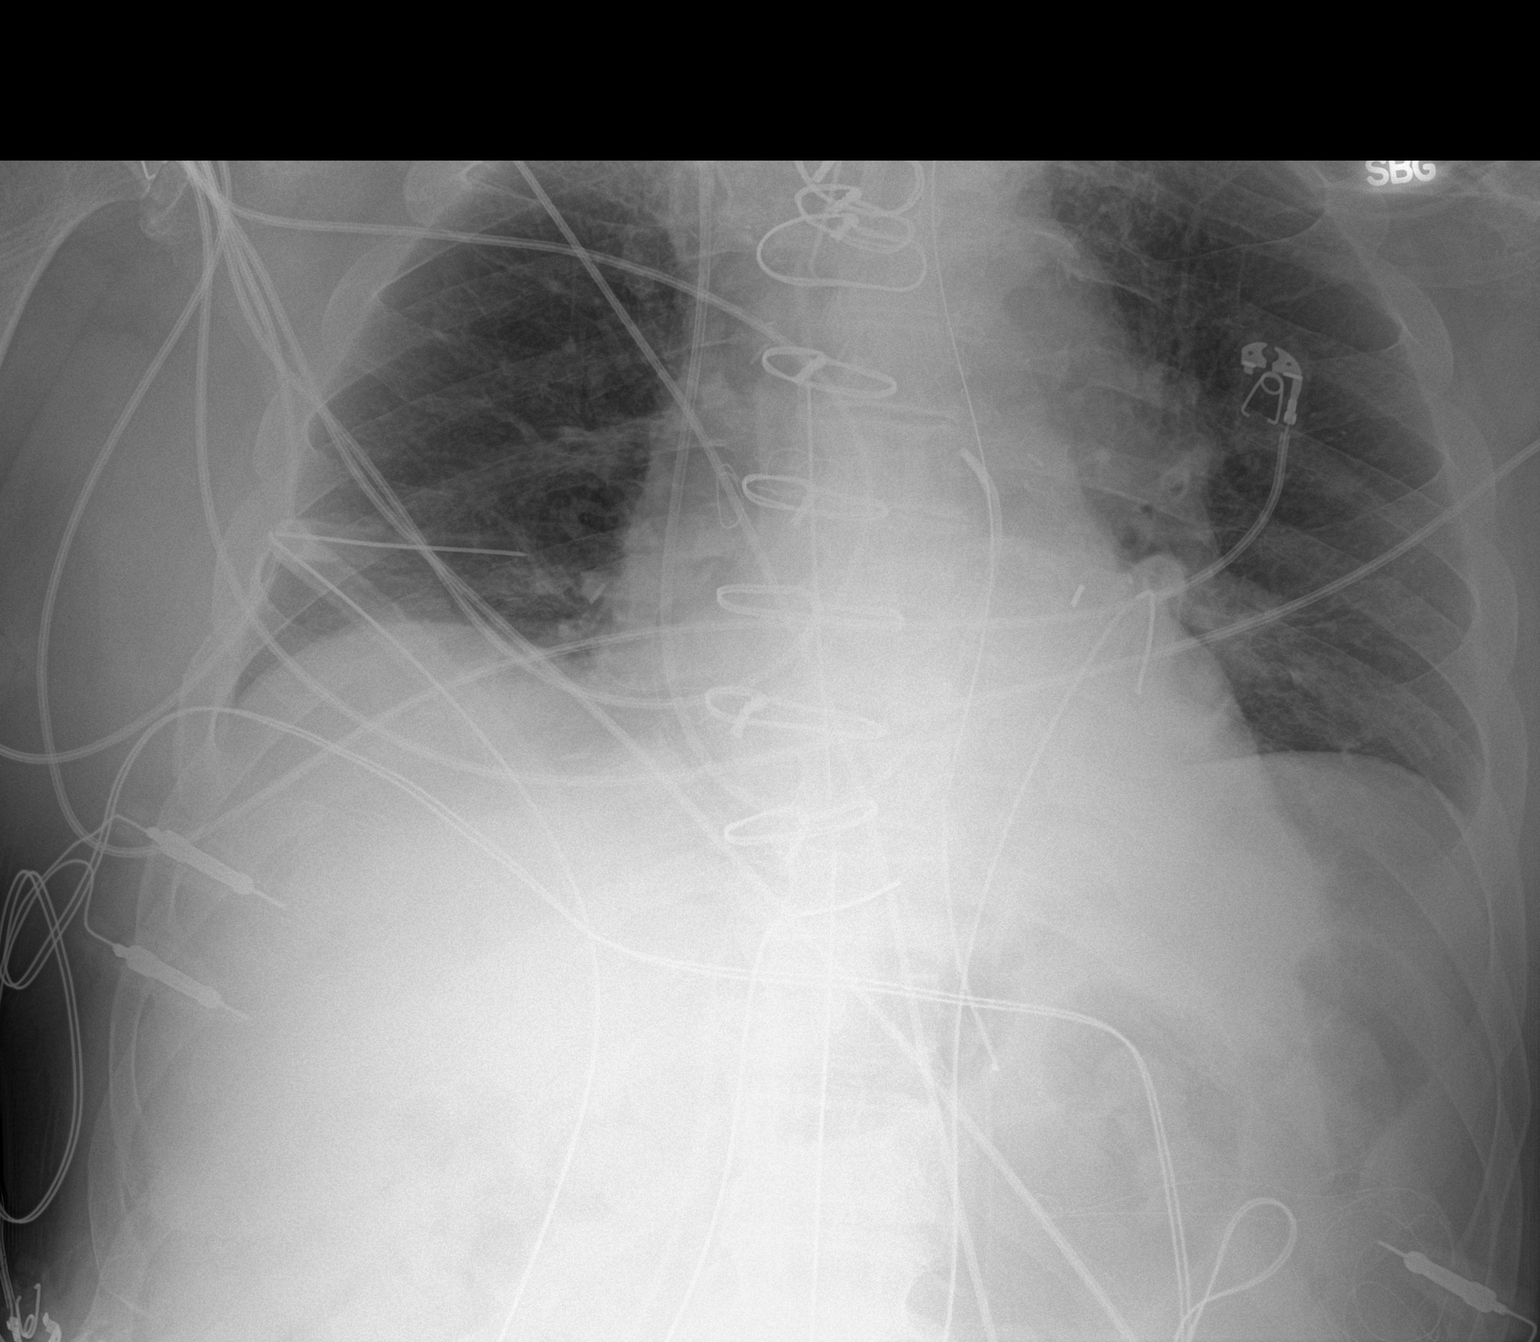

[1 of 1 positions shown; findings below may reference images not displayed]

FINDINGS: Cardiac shadow is within normal limits. Endotracheal tube,
mediastinal drain and bilateral thoracostomy catheters are noted.
Swan-Ganz catheter is noted in the pulmonary outflow tract.
Nasogastric catheter is noted extending into the stomach although
the proximal side port appears to lie within the distal esophagus.
The lungs are well aerated bilaterally with mild basilar atelectasis
on the right. No pneumothorax is seen. Postsurgical changes are
noted.
IMPRESSION: Status post coronary bypass grafting. Mild right basilar atelectasis
is noted.

## 2019-04-29 IMAGING — DX DG CHEST 1V PORT
1 series · 1 of 1 positions shown · non-contrast
Comparison: 07/16/2017

CLINICAL DATA: Postop CABG surgery.

EXAM:
PORTABLE CHEST 1 VIEW

[chest ap]
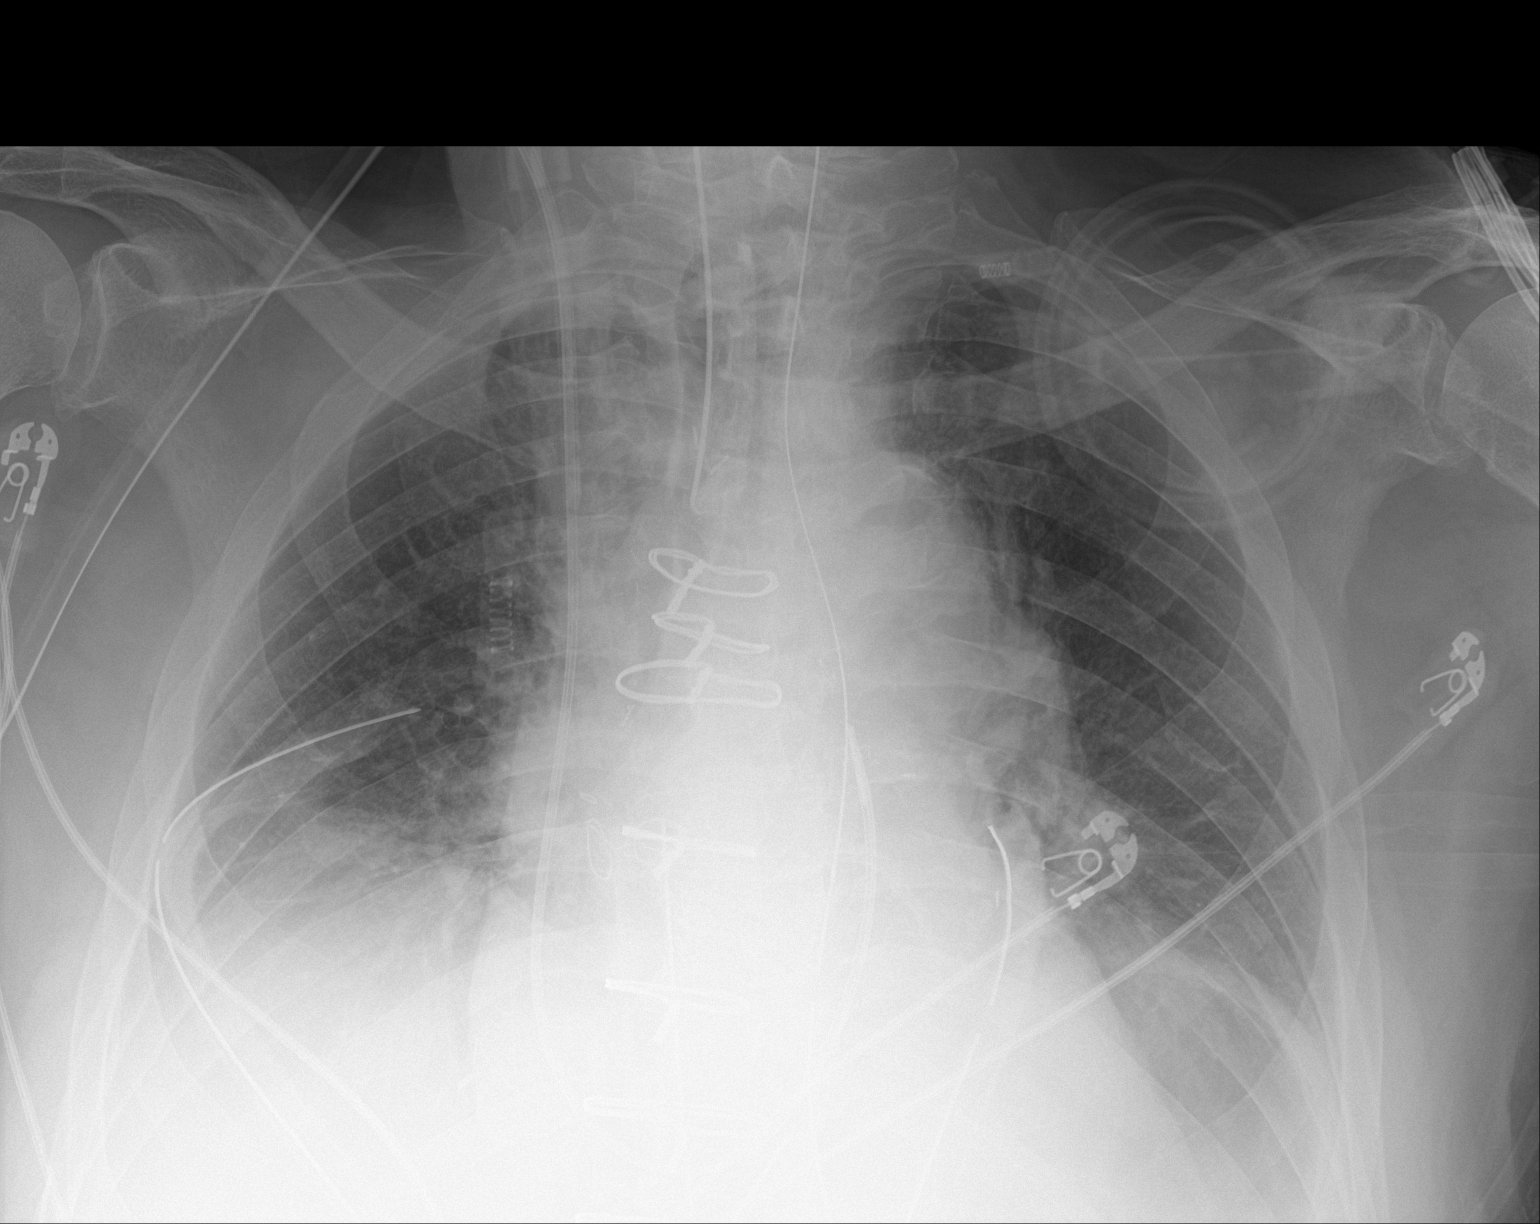

[1 of 1 positions shown; findings below may reference images not displayed]

FINDINGS: Mild increase in lung base opacity with since the prior exam, most
likely combinations of atelectasis and small effusiona. Lung volumes
remain low. Remainder of the lungs is clear. No evidence of a
pneumothorax.

No mediastinal widening.

The endotracheal tube, nasal/ orogastric tube, right internal
jugular Swan-Ganz catheter, mediastinal tube and bilateral chest
tubes are stable and well positioned.
IMPRESSION: 1. Mild increase in lung base atelectasis since the previous day's
study.
2. No other change. No mediastinal widening, pulmonary edema or
pneumothorax.

## 2020-04-28 ENCOUNTER — Encounter: Payer: Self-pay | Admitting: Cardiovascular Disease

## 2020-05-12 ENCOUNTER — Telehealth: Payer: Self-pay | Admitting: Cardiovascular Disease

## 2020-05-12 NOTE — Telephone Encounter (Signed)
Patient's daughter, Angelique Blonder, states the patient moved and he is not seeing a cardiologist in [place], [place]. She is requesting to have patient's records sent to Dr. Cathren Laine office. Their office may be contacted at [phone].

## 2020-05-21 NOTE — Telephone Encounter (Signed)
[facility] records faxed to [facility] Dr. Frederico Hamman 05/21/20

## 2020-07-27 ENCOUNTER — Encounter (HOSPITAL_COMMUNITY): Payer: Self-pay | Admitting: *Deleted

## 2020-07-27 ENCOUNTER — Observation Stay (HOSPITAL_COMMUNITY)
Admission: EM | Admit: 2020-07-27 | Discharge: 2020-07-28 | Disposition: A | Discharge status: Home / Self Care | Payer: Medicare HMO | Attending: Internal Medicine | Admitting: Internal Medicine

## 2020-07-27 ENCOUNTER — Other Ambulatory Visit: Payer: Self-pay

## 2020-07-27 DIAGNOSIS — Z79899 Other long term (current) drug therapy: Secondary | ICD-10-CM | POA: Insufficient documentation

## 2020-07-27 DIAGNOSIS — E118 Type 2 diabetes mellitus with unspecified complications: Secondary | ICD-10-CM | POA: Diagnosis present

## 2020-07-27 DIAGNOSIS — I2510 Atherosclerotic heart disease of native coronary artery without angina pectoris: Secondary | ICD-10-CM | POA: Diagnosis present

## 2020-07-27 DIAGNOSIS — I10 Essential (primary) hypertension: Secondary | ICD-10-CM | POA: Diagnosis not present

## 2020-07-27 DIAGNOSIS — I480 Paroxysmal atrial fibrillation: Principal | ICD-10-CM | POA: Insufficient documentation

## 2020-07-27 DIAGNOSIS — R079 Chest pain, unspecified: Secondary | ICD-10-CM | POA: Diagnosis not present

## 2020-07-27 DIAGNOSIS — E785 Hyperlipidemia, unspecified: Secondary | ICD-10-CM | POA: Diagnosis present

## 2020-07-27 DIAGNOSIS — Z951 Presence of aortocoronary bypass graft: Secondary | ICD-10-CM | POA: Diagnosis not present

## 2020-07-27 DIAGNOSIS — Z7984 Long term (current) use of oral hypoglycemic drugs: Secondary | ICD-10-CM | POA: Insufficient documentation

## 2020-07-27 DIAGNOSIS — Z20822 Contact with and (suspected) exposure to covid-19: Secondary | ICD-10-CM | POA: Diagnosis not present

## 2020-07-27 DIAGNOSIS — Z7982 Long term (current) use of aspirin: Secondary | ICD-10-CM | POA: Insufficient documentation

## 2020-07-27 DIAGNOSIS — M79602 Pain in left arm: Secondary | ICD-10-CM | POA: Diagnosis present

## 2020-07-27 DIAGNOSIS — E119 Type 2 diabetes mellitus without complications: Secondary | ICD-10-CM | POA: Diagnosis not present

## 2020-07-27 HISTORY — DX: Cardiac arrhythmia, unspecified: I49.9

## 2020-07-27 HISTORY — DX: Hypothyroidism, unspecified: E03.9

## 2020-07-27 NOTE — ED Triage Notes (Signed)
Pt arrived by [facility]. Reported having right hand pain that radiated up his arm. Denies cp. Pain resolved pta. Pt is in afib, hx of same.

## 2020-07-28 ENCOUNTER — Observation Stay (HOSPITAL_BASED_OUTPATIENT_CLINIC_OR_DEPARTMENT_OTHER): Payer: Medicare HMO

## 2020-07-28 ENCOUNTER — Encounter (HOSPITAL_COMMUNITY): Payer: Self-pay | Admitting: Family Medicine

## 2020-07-28 ENCOUNTER — Emergency Department (HOSPITAL_COMMUNITY): Payer: Medicare HMO

## 2020-07-28 DIAGNOSIS — R079 Chest pain, unspecified: Secondary | ICD-10-CM

## 2020-07-28 DIAGNOSIS — I2510 Atherosclerotic heart disease of native coronary artery without angina pectoris: Secondary | ICD-10-CM

## 2020-07-28 DIAGNOSIS — I10 Essential (primary) hypertension: Secondary | ICD-10-CM | POA: Diagnosis not present

## 2020-07-28 DIAGNOSIS — I480 Paroxysmal atrial fibrillation: Secondary | ICD-10-CM

## 2020-07-28 LAB — ECHOCARDIOGRAM COMPLETE
Area-P 1/2: 4.39 cm2
Height: 70.5 in
S' Lateral: 3.6 cm
Weight: 3492.09 oz

## 2020-07-28 LAB — BASIC METABOLIC PANEL
Anion gap: 12 (ref 5–15)
BUN: 16 mg/dL (ref 8–23)
CO2: 22 mmol/L (ref 22–32)
Calcium: 9.4 mg/dL (ref 8.9–10.3)
Chloride: 107 mmol/L (ref 98–111)
Creatinine, Ser: 1.12 mg/dL (ref 0.61–1.24)
GFR, Estimated: >60 mL/min (ref >60)
Glucose, Bld: 124 mg/dL — ABNORMAL HIGH (ref 70–99)
Potassium: 4 mmol/L (ref 3.5–5.1)
Sodium: 141 mmol/L (ref 135–145)

## 2020-07-28 LAB — TROPONIN I (HIGH SENSITIVITY)
Troponin I (High Sensitivity): 10 ng/L (ref <18)
Troponin I (High Sensitivity): 9 ng/L (ref <18)
Troponin I (High Sensitivity): 8 ng/L (ref <18)
Troponin I (High Sensitivity): 8 ng/L (ref <18)

## 2020-07-28 LAB — LIPID PANEL
Cholesterol: 150 mg/dL (ref 0–200)
HDL: 46 mg/dL (ref >40)
LDL Cholesterol: 97 mg/dL (ref 0–99)
Total CHOL/HDL Ratio: 3.3 RATIO
Triglycerides: 34 mg/dL (ref <150)
VLDL: 7 mg/dL (ref 0–40)

## 2020-07-28 LAB — URINALYSIS, ROUTINE W REFLEX MICROSCOPIC
Bilirubin Urine: NEGATIVE
Glucose, UA: NEGATIVE mg/dL
Hgb urine dipstick: NEGATIVE
Ketones, ur: NEGATIVE mg/dL
Leukocytes,Ua: NEGATIVE
Nitrite: NEGATIVE
Protein, ur: NEGATIVE mg/dL
Specific Gravity, Urine: 1.021 (ref 1.005–1.030)
pH: 5 (ref 5.0–8.0)

## 2020-07-28 LAB — RESP PANEL BY RT-PCR (FLU A&B, COVID) ARPGX2
Influenza A by PCR: NEGATIVE
Influenza B by PCR: NEGATIVE
SARS Coronavirus 2 by RT PCR: NEGATIVE

## 2020-07-28 LAB — HEMOGLOBIN A1C
Hgb A1c MFr Bld: 6.2 % — ABNORMAL HIGH (ref 4.8–5.6)
Mean Plasma Glucose: 131.24 mg/dL

## 2020-07-28 LAB — CBG MONITORING, ED
Glucose-Capillary: 141 mg/dL — ABNORMAL HIGH (ref 70–99)
Glucose-Capillary: 132 mg/dL — ABNORMAL HIGH (ref 70–99)
Glucose-Capillary: 89 mg/dL (ref 70–99)

## 2020-07-28 LAB — CBC
HCT: 47.1 % (ref 39.0–52.0)
Hemoglobin: 15.4 g/dL (ref 13.0–17.0)
MCH: 29.8 pg (ref 26.0–34.0)
MCHC: 32.7 g/dL (ref 30.0–36.0)
MCV: 91.1 fL (ref 80.0–100.0)
Platelets: 222 10*3/uL (ref 150–400)
RBC: 5.17 MIL/uL (ref 4.22–5.81)
RDW: 13.2 % (ref 11.5–15.5)
WBC: 11.1 10*3/uL — ABNORMAL HIGH (ref 4.0–10.5)
nRBC: 0 % (ref 0.0–0.2)

## 2020-07-28 LAB — TSH: TSH: 0.503 u[IU]/mL (ref 0.350–4.500)

## 2020-07-28 LAB — MAGNESIUM: Magnesium: 1.7 mg/dL (ref 1.7–2.4)

## 2020-07-28 MED ORDER — APIXABAN 5 MG PO TABS: 5.0000 mg | ORAL_TABLET | Freq: Two times a day (BID) | ORAL | 0 refills | Status: AC

## 2020-07-28 MED ORDER — ASPIRIN EC 81 MG PO TBEC: 81.0000 mg | DELAYED_RELEASE_TABLET | Freq: Every day | ORAL | Status: DC

## 2020-07-28 MED ORDER — HEPARIN BOLUS VIA INFUSION
4000.0000 [IU] | Freq: Once | INTRAVENOUS | Status: AC
  Administered 2020-07-28: 05:00:00 4000 [IU] via INTRAVENOUS
  Filled 2020-07-28: qty 4000

## 2020-07-28 MED ORDER — INSULIN DETEMIR 100 UNIT/ML ~~LOC~~ SOLN
5.0000 [IU] | Freq: Two times a day (BID) | SUBCUTANEOUS | Status: DC
  Filled 2020-07-28 (×2): qty 0.05

## 2020-07-28 MED ORDER — APIXABAN 5 MG PO TABS
5.0000 mg | ORAL_TABLET | Freq: Two times a day (BID) | ORAL | Status: DC
  Administered 2020-07-28: 08:00:00 5 mg via ORAL
  Filled 2020-07-28: qty 1

## 2020-07-28 MED ORDER — REGADENOSON 0.4 MG/5ML IV SOLN
INTRAVENOUS | Status: AC
  Filled 2020-07-28: qty 5

## 2020-07-28 MED ORDER — ACETAMINOPHEN 325 MG PO TABS: 650.0000 mg | ORAL_TABLET | Freq: Four times a day (QID) | ORAL | Status: DC | PRN

## 2020-07-28 MED ORDER — HEPARIN SODIUM (PORCINE) 5000 UNIT/ML IJ SOLN: 5000.0000 [IU] | Freq: Three times a day (TID) | INTRAMUSCULAR | Status: DC

## 2020-07-28 MED ORDER — TAMSULOSIN HCL 0.4 MG PO CAPS
0.4000 mg | ORAL_CAPSULE | Freq: Every day | ORAL | Status: DC
  Administered 2020-07-28: 12:00:00 0.4 mg via ORAL
  Filled 2020-07-28: qty 1

## 2020-07-28 MED ORDER — INSULIN ASPART 100 UNIT/ML ~~LOC~~ SOLN: 0.0000 [IU] | Freq: Every day | SUBCUTANEOUS | Status: DC

## 2020-07-28 MED ORDER — HEPARIN (PORCINE) 25000 UT/250ML-% IV SOLN
1200.0000 [IU]/h | INTRAVENOUS | Status: DC
  Administered 2020-07-28: 05:00:00 1200 [IU]/h via INTRAVENOUS
  Filled 2020-07-28: qty 250

## 2020-07-28 MED ORDER — METOPROLOL TARTRATE 25 MG PO TABS
25.0000 mg | ORAL_TABLET | Freq: Two times a day (BID) | ORAL | Status: DC
  Administered 2020-07-28: 12:00:00 25 mg via ORAL
  Filled 2020-07-28: qty 1

## 2020-07-28 MED ORDER — METFORMIN HCL 500 MG PO TABS: 500.0000 mg | ORAL_TABLET | Freq: Two times a day (BID) | ORAL | Status: DC

## 2020-07-28 MED ORDER — LEVOTHYROXINE SODIUM 100 MCG PO TABS
100.0000 ug | ORAL_TABLET | Freq: Every day | ORAL | Status: DC
  Administered 2020-07-28: 06:00:00 100 ug via ORAL

## 2020-07-28 MED ORDER — ONDANSETRON HCL 4 MG/2ML IJ SOLN: 4.0000 mg | Freq: Four times a day (QID) | INTRAMUSCULAR | Status: DC | PRN

## 2020-07-28 MED ORDER — INSULIN ASPART 100 UNIT/ML ~~LOC~~ SOLN: 3.0000 [IU] | Freq: Three times a day (TID) | SUBCUTANEOUS | Status: DC

## 2020-07-28 MED ORDER — LEVOTHYROXINE SODIUM 100 MCG PO TABS
100.0000 ug | ORAL_TABLET | Freq: Once | ORAL | Status: DC
  Filled 2020-07-28: qty 1

## 2020-07-28 MED ORDER — ATORVASTATIN CALCIUM 40 MG PO TABS: 40.0000 mg | ORAL_TABLET | Freq: Every day | ORAL | Status: DC

## 2020-07-28 MED ORDER — OXYBUTYNIN CHLORIDE 5 MG PO TABS
5.0000 mg | ORAL_TABLET | Freq: Three times a day (TID) | ORAL | Status: DC
  Administered 2020-07-28: 12:00:00 5 mg via ORAL
  Filled 2020-07-28: qty 1

## 2020-07-28 MED ORDER — REGADENOSON 0.4 MG/5ML IV SOLN: 0.4000 mg | Freq: Once | INTRAVENOUS | Status: DC

## 2020-07-28 MED ORDER — ACETAMINOPHEN 325 MG PO TABS: 650.0000 mg | ORAL_TABLET | ORAL | Status: DC | PRN

## 2020-07-28 MED ORDER — TECHNETIUM TC 99M TETROFOSMIN IV KIT
10.0000 | PACK | Freq: Once | INTRAVENOUS | Status: AC | PRN
  Administered 2020-07-28: 10 via INTRAVENOUS

## 2020-07-28 MED ORDER — INSULIN ASPART 100 UNIT/ML ~~LOC~~ SOLN
0.0000 [IU] | Freq: Three times a day (TID) | SUBCUTANEOUS | Status: DC
  Administered 2020-07-28: 08:00:00 1 [IU] via SUBCUTANEOUS

## 2020-07-28 NOTE — ED Notes (Signed)
Patient transported to nuclear medicine 

## 2020-07-28 NOTE — H&P (Signed)
History and Physical    Cody Rivers EAV:[id number] DOB: 08/12/1931 DOA: 07/27/2020  PCP: Joycelyn RuaMeyers, Stephen, MD   Patient coming from:  Home  Chief Complaint:  Chest pain  HPI: Cody Rivers is a 86 y.o. male with medical history significant for with complaints of arm pain and heart palpitations.  Patient does have a history of coronary artery disease, status post bypass surgery in 2018.  Presents by EMS for palpitations associated with left arm and hand pain with chest tightness. He was given aspirin and has had resolution of the pain.  EMS found the patient to be in atrial fibrillation. Patient apparently does have a history of A. fib but is not currently anticoagulated because he has not had A. fib in years since he has had an ablation. He states he did not have SOB, nausea or diaphoresis with the chest pain.  He was seen last month by his cardiologist and was being scheduled for an outpatient stress test for recurrence of anginal chest pain.  He has not had the stress test yet.  He denies tobacco, alcohol, or drug use.  He lives in [place] with his daughter but is currently visiting his son here in [place] for the holidays for the next few weeks.  Review of Systems:  General: Denies weakness, fever, chills, weight loss, night sweats.  Denies dizziness.  Denies change in appetite HENT: Denies head trauma, headache, denies change in hearing, tinnitus.  Denies nasal congestion or bleeding.  Denies sore throat, sores in mouth.  Denies difficulty swallowing Eyes: Denies blurry vision, pain in eye, drainage.  Denies discoloration of eyes. Neck: Denies pain.  Denies swelling.  Denies pain with movement. Cardiovascular: Denies chest pain, palpitations.  Denies edema.  Denies orthopnea Respiratory: Denies shortness of breath, cough.  Denies wheezing.  Denies sputum production Gastrointestinal: Denies abdominal pain, swelling.  Denies nausea, vomiting, diarrhea.  Denies melena.  Denies  hematemesis. Musculoskeletal: Denies limitation of movement.  Denies deformity or swelling.  Denies pain.  Denies arthralgias or myalgias. Genitourinary: Denies pelvic pain.  Denies urinary frequency or hesitancy.  Denies dysuria.  Skin: Denies rash.  Denies petechiae, purpura, ecchymosis. Neurological: Denies headache.  Denies syncope.  Denies seizure activity.  Denies weakness or paresthesia.  Denies slurred speech, drooping face.  Denies visual change. Psychiatric: Denies depression, anxiety.  Denies suicidal thoughts or ideation.  Denies hallucinations.  Past Medical History:  Diagnosis Date  . Arthritis   . Atrial flutter (HCC) 11/20/2008   Qualifier: Diagnosis of  By: Johney FrameAllred, MD, Fayrene FearingJames    . Chest pain with moderate risk for cardiac etiology 07/14/2017  . Coronary artery disease   . Diabetes mellitus without complication (HCC)   . Dysrhythmia   . Hyperlipidemia   . Hypertension   . Hypothyroidism     Past Surgical History:  Procedure Laterality Date  . ATRIAL FLUTTER ABLATION  2010   Dr Johney FrameAllred  . CARDIAC SURGERY    . CORONARY ARTERY BYPASS GRAFT N/A 07/16/2017   Procedure: CORONARY ARTERY BYPASS GRAFTING (CABG) TIMES SIX  USING RIGHT AND LEFT  INTERNAL MAMMARY ARTERIES  AND RIGHT AND LEFT  SAPHENOUS VEIN HARVESTED ENDOSCOPICALLY;  Surgeon: Alleen BorneBartle, Bryan K, MD;  Location: [facility] OR;  Service: Open Heart Surgery;  Laterality: N/A;  . LEFT HEART CATH AND CORONARY ANGIOGRAPHY N/A 07/15/2017   Procedure: LEFT HEART CATH AND CORONARY ANGIOGRAPHY;  Surgeon: Tonny Bollmanooper, Michael, MD;  [facility];  Service: Cardiovascular;  Laterality: N/A;  . REPLACEMENT TOTAL KNEE  BILATERAL    . ROTATOR CUFF REPAIR     LEFT  . TEE WITHOUT CARDIOVERSION N/A 07/16/2017   Procedure: TRANSESOPHAGEAL ECHOCARDIOGRAM (TEE);  Surgeon: Alleen Borne, MD;  Location: [facility] OR;  Service: Open Heart Surgery;  Laterality: N/A;    Social History  reports that he has never smoked. He has never used smokeless  tobacco. He reports current alcohol use. He reports that he does not use drugs.  Allergies  Allergen Reactions  . Robitussin 12 Hour Cough [Dextromethorphan Polistirex Er] Nausea And Vomiting    Family History  Problem Relation Age of Onset  . Cancer Father   . Diabetes Maternal Grandfather 90     Prior to Admission medications   Medication Sig Start Date End Date Taking? Authorizing Provider  acetaminophen (TYLENOL) 325 MG tablet Take 2 tablets (650 mg total) by mouth every 6 (six) hours as needed for mild pain. 07/23/17   Ardelle Balls, PA-C  aspirin 81 MG tablet Take 1 tablet (81 mg total) by mouth daily. 11/21/17   Tonny Bollman, MD  atorvastatin (LIPITOR) 40 MG tablet Take 1 tablet (40 mg total) by mouth daily at 6 PM. 07/31/18   Tonny Bollman, MD  calcium carbonate (TUMS - DOSED IN MG ELEMENTAL CALCIUM) 500 MG chewable tablet Chew 1 tablet by mouth daily.    [provider]  cephALEXin (KEFLEX) 500 MG capsule Take 1 capsule (500 mg total) by mouth 3 (three) times daily. 08/08/17   Raeford Razor, MD  docusate sodium (COLACE) 100 MG capsule Take 100 mg by mouth 2 (two) times daily.    [provider]  glipiZIDE-metformin (METAGLIP) 2.5-500 MG tablet Take 2 tablets by mouth. 10/30/17   [provider]  glyBURIDE-metformin (GLUCOVANCE) 2.5-500 MG tablet Take 1 tablet by mouth daily with breakfast.    [provider]  levothyroxine (SYNTHROID, LEVOTHROID) 100 MCG tablet Take 1 tablet by mouth once. 10/01/17   [provider]  metoprolol tartrate (LOPRESSOR) 25 MG tablet Take 1 tablet (25 mg total) by mouth 2 (two) times daily. 07/31/18   Tonny Bollman, MD  Multiple Vitamin (MULTIVITAMIN) tablet Take 1 tablet by mouth daily.    [provider]  Multiple Vitamins-Minerals (PRESERVISION AREDS PO) Take 1 tablet by mouth daily.    [provider]  oxybutynin (DITROPAN) 5 MG tablet Take 5 mg by mouth 3 (three) times  daily.    [provider]  polyethylene glycol (MIRALAX / GLYCOLAX) packet Take 17 g by mouth daily.    [provider]  tamsulosin (FLOMAX) 0.4 MG CAPS capsule Take 0.4 mg by mouth daily.    [provider]  vitamin B-12 (CYANOCOBALAMIN) 1000 MCG tablet Take 1,000 mcg by mouth daily.    [provider]    Physical Exam: Vitals:   07/28/20 0400 07/28/20 0430 07/28/20 0445 07/28/20 0515  BP: (!) 142/87 (!) 149/88 (!) 125/92 (!) 151/99  Pulse: 78 72 73 62  Resp: 15 18 17 16   Temp:      TempSrc:      SpO2: 96% 98% 95% 98%    Constitutional: NAD, calm, comfortable Vitals:   07/28/20 0400 07/28/20 0430 07/28/20 0445 07/28/20 0515  BP: (!) 142/87 (!) 149/88 (!) 125/92 (!) 151/99  Pulse: 78 72 73 62  Resp: 15 18 17 16   Temp:      TempSrc:      SpO2: 96% 98% 95% 98%   General: WDWN, Alert and oriented x3.  Eyes: EOMI,  PERRL, conjunctivae normal.  Sclera nonicteric HENT:  Tat [place]/AT, external ears normal.  Nares patent without epistasis.  Mucous membranes are moist. Posterior pharynx clear of any exudate or lesions. Neck: Soft, normal range of motion, supple, no masses, no thyromegaly.  Trachea midline Respiratory: clear to auscultation bilaterally, no wheezing, no crackles. Normal respiratory effort. No accessory muscle use.  Cardiovascular: Regular rate and rhythm, no murmurs / rubs / gallops. No extremity edema. 2+ pedal pulses.  Abdomen: Soft, no tenderness, nondistended, no rebound or guarding.  No masses palpated. Bowel sounds normoactive Musculoskeletal: FROM. no clubbing / cyanosis. No joint deformity upper and lower extremities. Normal muscle tone.  Skin: Warm, dry, intact no rashes, lesions, ulcers. No induration Neurologic: CN 2-12 grossly intact.  Normal speech.  Sensation intact, Strength 5/5 in all extremities.   Psychiatric: Normal judgment and insight.  Normal mood.   CHAD-Vasc score is 5   Labs on Admission: I have personally reviewed  following labs and imaging studies  CBC: Recent Labs  Lab 07/27/20 2347  WBC 11.1*  HGB 15.4  HCT 47.1  MCV 91.1  PLT 222    Basic Metabolic Panel: Recent Labs  Lab 07/27/20 2347  NA 141  K 4.0  CL 107  CO2 22  GLUCOSE 124*  BUN 16  CREATININE 1.12  CALCIUM 9.4    GFR: CrCl cannot be calculated (Unknown ideal weight.).  Liver Function Tests: No results for input(s): AST, ALT, ALKPHOS, BILITOT, PROT, ALBUMIN in the last 168 hours.  Urine analysis:    Component Value Date/Time   COLORURINE YELLOW 08/08/2017 1150   APPEARANCEUR CLOUDY (A) 08/08/2017 1150   LABSPEC 1.015 08/08/2017 1150   PHURINE 7.0 08/08/2017 1150   GLUCOSEU NEGATIVE 08/08/2017 1150   HGBUR TRACE (A) 08/08/2017 1150   BILIRUBINUR NEGATIVE 08/08/2017 1150   KETONESUR NEGATIVE 08/08/2017 1150   PROTEINUR 30 (A) 08/08/2017 1150   NITRITE POSITIVE (A) 08/08/2017 1150   LEUKOCYTESUR MODERATE (A) 08/08/2017 1150    Radiological Exams on Admission: DG Chest Port 1 View  Result Date: 07/28/2020 CLINICAL DATA:  Chest pain EXAM: PORTABLE CHEST 1 VIEW COMPARISON:  08/17/2017 FINDINGS: Extensive artifact from EKG leads. Prior median sternotomy for CABG. There is no edema, consolidation, effusion, or pneumothorax. Normal heart size and stable aortic contours IMPRESSION: No acute finding. Electronically Signed   By: Marnee Spring M.D.   On: 07/28/2020 05:02    EKG: Independently reviewed.  EKG shows normal sinus rhythm with PACs.  Nonspecific ST changes but no acute ST elevation or depression.  QTc 427  Assessment/Plan Principal Problem:   Chest pain with moderate risk for cardiac etiology Patient replaced on cardiac telemetry for observation for chest pain.  Obtain serial troponin levels.  If troponins remain negative we will proceed with stress test.  Will consult cardiology in am with significant cardiovascular history of patient.  Antiplatelet therapy with aspirin daily.  Check lipid panel.   Continue statin therapy.  Monitor blood pressure.  Nitroglycerin as needed.  Pulmonal oxygen as needed to maintain O2 sat between 92-96%  Active Problems:   Type 2 diabetes mellitus with complication, without long-term current use of insulin  Continue home metformin dose. Hold SU therapy while in hospital.  Monitor blood sugars and SSI as needed for glycemic control.  Check HgbA1c    HYPERTENSION, BENIGN Continue metoprolol. Monitor BP.     CAD (coronary artery disease) Continue beta blocker therapy and aspirin.     Hyperlipidemia Continue statin.  S/P CABG x 6    AF (paroxysmal atrial fibrillation) Pt on heparin infusion at this time for ACS. Will need to discuss long term anticoagulation with pt and his family after testing completed.      DVT prophylaxis: Mr. Mohr has been placed on heparin infusion for anticoagulation Code Status:   Full code Family Communication:  Diagnosis and plan discussed with patient.  Patient verbalized understanding agrees with plan.  Further recommendations to follow as clinical indicated Disposition Plan:   Patient is from:  Home  Anticipated DC to:  Home  Anticipated DC date:  Anticipate less than 2 midnight stay for work-up and treatment  Anticipated DC barriers: No barriers to discharge identified  Consults called:  Cardiology be consulted in the morning Admission status:  Observation  Carlton Adam MD [facility]  How to contact the [facility] Attending or Consulting provider 7A - 7P or covering provider during after hours 7P -7A, for this patient?   1. Check the care team in [facility] and look for a) attending/consulting [facility] provider listed and b) the [facility] team listed 2. Log into [email or web address] and use [facility] universal password to access. If you do not have the password, please contact the hospital operator. 3. Locate the [facility] provider you are looking for under [facility] and page to a number that you can be directly  reached. 4. If you still have difficulty reaching the provider, please page the [facility] (Director on Call) for the Hospitalists listed on amion for assistance.  07/28/2020, 5:26 AM

## 2020-07-28 NOTE — ED Notes (Signed)
Patient returned from nuclear med.

## 2020-07-28 NOTE — Consult Note (Addendum)
Cardiology Consultation:   Patient ID: [place] MRN: [id number]; DOB: Apr 11, 1932  Admit date: 07/27/2020 Date of Consult: 07/28/2020  Primary Care Provider: Joycelyn Rua, MD [facility] Cardiologist: Previously seen by Dr. Excell [place]. Now follows with Dr. Melvyn [place] in [place], [place]. [facility] Electrophysiologist:  None    Patient Profile:   [place] is a 86 y.o. male with a history of CAD s/p CABG x5 in 07/2017, post-op atrial fibrillation treated with Amiodarone, hypertension, hyperlipidemia, diabetes, and hypothyroidism who is being seen today for the evaluation of atrial fibrillation and arm pain at the request of Dr. Rachael [place].  History of Present Illness:   Mr. [place] is a 86 year old male with the above history. He was previously followed by Dr. Excell [place] but moved to [place] a couple of years ago and now follows with Dr. Melvyn [place].   Cardiac catheterization in 07/2017 showed severe multivessel CAD including severe left main disease. He underwent CABG x5 on 07/16/2017 with LIMA-LAD, RIMA-Diag, SVG-Ramus, SVG-OM1-OM2, SVG-PDA. He developed post-op atrial fibrillation but converted to normal sinus rhythm on IV Amiodarone and was then transitioned to PO. He was never started on anticoagulation.  Patient was last seen by our office in 06/2018 at which time he reported substernal chest numbness/discomfort not necessarily related to exertion. An exercise Myoview was ordered for further evaluation but it does not look like this was ever done. Since then, patient has moved to [place] and he now follows with Dr. Melvyn [facility] at [facility] Patient was last seen by Dr. Melvyn Rivers in 01/2019 at which time he was doing well from a cardiac standpoint with no recurrent chest pain.  Patient is in town visiting his family for the holidays. He presented to the ED yesterday for hand pain and shaking. He was noted to be in atrial fibrillation with controlled rate and non-specific ST/T changes.  High-sensitivity troponin negative x2. Chest x-ray showed no acute findings. WBC 11.1, Hgb 15.4, Plts 222. Na 141, K 4.0, Glucose 124, BUN 16, Cr 1.12. Patient was started on IV Heparin and admitted for further evaluation. Admitting provider went ahead and ordered a stress test given that was the plan in 06/2018.   I was called down to see patient in nuclear medicine to proctor stress test. Patient denied any chest pain, shortness of breath, or palpitations. He reports he came to the ED for left hand pain and generalized shaking (which he attributed to being very cold) for the last couple of days. He is adamant that he has not had any chest pain. Hand pain improved with massaging and was not worse with activity. No arm pain. No orthopnea, PND, edema. No lightheadedness, dizziness, syncope. No recent fevers or illness. No cough or nasal congestion. No GI symptoms. No abnormal bleeding in urine or stools. He was not even aware why he was taken back to nuclear medicine and declined stress test.    Past Medical History:  Diagnosis Date  . Arthritis   . Atrial flutter (HCC) 11/20/2008   Qualifier: Diagnosis of  By: [place] Frame, MD, [place]    . Chest pain with moderate risk for cardiac etiology 07/14/2017  . Coronary artery disease   . Diabetes mellitus without complication (HCC)   . Dysrhythmia   . Hyperlipidemia   . Hypertension   . Hypothyroidism     Past Surgical History:  Procedure Laterality Date  . ATRIAL FLUTTER ABLATION  2010   Dr [place]  . CARDIAC SURGERY    . CORONARY ARTERY BYPASS GRAFT N/A  07/16/2017   Procedure: CORONARY ARTERY BYPASS GRAFTING (CABG) TIMES SIX  USING RIGHT AND LEFT  INTERNAL MAMMARY ARTERIES  AND RIGHT AND LEFT  SAPHENOUS VEIN HARVESTED ENDOSCOPICALLY;  Surgeon: [place] Borne, MD;  Location: [facility] OR;  Service: Open Heart Surgery;  Laterality: N/A;  . LEFT HEART CATH AND CORONARY ANGIOGRAPHY N/A 07/15/2017   Procedure: LEFT HEART CATH AND CORONARY ANGIOGRAPHY;  Surgeon:  Tonny Bollman, MD;  Location[facility] INVASIVE CV LAB;  Service: Cardiovascular;  Laterality: N/A;  . REPLACEMENT TOTAL KNEE BILATERAL    . ROTATOR CUFF REPAIR     LEFT  . TEE WITHOUT CARDIOVERSION N/A 07/16/2017   Procedure: TRANSESOPHAGEAL ECHOCARDIOGRAM (TEE);  Surgeon: [place] Borne, MD;  Location: [facility];  Service: Open Heart Surgery;  Laterality: N/A;     Home Medications:  Prior to Admission medications   Medication Sig Start Date End Date Taking? Authorizing Provider  acetaminophen (TYLENOL) 325 MG tablet Take 2 tablets (650 mg total) by mouth every 6 (six) hours as needed for mild pain. 07/23/17   Ardelle Balls, PA-C  aspirin 81 MG tablet Take 1 tablet (81 mg total) by mouth daily. 11/21/17   Tonny Bollman, MD  atorvastatin (LIPITOR) 40 MG tablet Take 1 tablet (40 mg total) by mouth daily at 6 PM. 07/31/18   Tonny Bollman, MD  calcium carbonate (TUMS - DOSED IN MG ELEMENTAL CALCIUM) 500 MG chewable tablet Chew 1 tablet by mouth daily.    [provider]  cephALEXin (KEFLEX) 500 MG capsule Take 1 capsule (500 mg total) by mouth 3 (three) times daily. 08/08/17   Raeford Razor, MD  docusate sodium (COLACE) 100 MG capsule Take 100 mg by mouth 2 (two) times daily.    [provider]  glipiZIDE-metformin (METAGLIP) 2.5-500 MG tablet Take 2 tablets by mouth. 10/30/17   [provider]  glyBURIDE-metformin (GLUCOVANCE) 2.5-500 MG tablet Take 1 tablet by mouth daily with breakfast.    [provider]  levothyroxine (SYNTHROID, LEVOTHROID) 100 MCG tablet Take 1 tablet by mouth once. 10/01/17   [provider]  metoprolol tartrate (LOPRESSOR) 25 MG tablet Take 1 tablet (25 mg total) by mouth 2 (two) times daily. 07/31/18   Tonny Bollman, MD  Multiple Vitamin (MULTIVITAMIN) tablet Take 1 tablet by mouth daily.    [provider]  Multiple Vitamins-Minerals (PRESERVISION AREDS PO) Take 1 tablet by mouth daily.    [provider]  oxybutynin (DITROPAN) 5 MG tablet Take 5 mg by mouth 3 (three) times daily.    [provider]  polyethylene glycol (MIRALAX / GLYCOLAX) packet Take 17 g by mouth daily.    [provider]  tamsulosin (FLOMAX) 0.4 MG CAPS capsule Take 0.4 mg by mouth daily.    [provider]  vitamin B-12 (CYANOCOBALAMIN) 1000 MCG tablet Take 1,000 mcg by mouth daily.    [provider]    Inpatient Medications: Scheduled Meds: . apixaban  5 mg Oral BID  . aspirin EC  81 mg Oral Daily  . atorvastatin  40 mg Oral q1800  . insulin aspart  0-5 Units Subcutaneous QHS  . insulin aspart  0-9 Units Subcutaneous TID WC  . insulin aspart  3 Units Subcutaneous TID WC  . insulin detemir  5 Units Subcutaneous BID  . levothyroxine  100 mcg Oral Q0600  . metoprolol tartrate  25 mg Oral BID  . oxybutynin  5 mg Oral TID  . regadenoson  0.4 mg Intravenous Once  .  tamsulosin  0.4 mg Oral Daily   Continuous Infusions:  PRN Meds: acetaminophen, ondansetron (ZOFRAN) IV  Allergies:    Allergies  Allergen Reactions  . Robitussin 12 Hour Cough [Dextromethorphan Polistirex Er] Nausea And Vomiting    Social History:   Social History   Socioeconomic History  . Marital status: Widowed    Spouse name: Not on file  . Number of children: Not on file  . Years of education: Not on file  . Highest education level: Not on file  Occupational History  . Occupation: retired  Tobacco Use  . Smoking status: Never Smoker  . Smokeless tobacco: Never Used  Vaping Use  . Vaping Use: Never used  Substance and Sexual Activity  . Alcohol use: Yes  . Drug use: No  . Sexual activity: Never  Other Topics Concern  . Not on file  Social History Narrative   Pt lives with his son.    Social Determinants of Health   Financial Resource Strain: Not on file  Food Insecurity: Not on file  Transportation Needs: Not on file  Physical Activity: Not on file  Stress: Not on file  Social  Connections: Not on file  Intimate Partner Violence: Not on file    Family History:   Family History  Problem Relation Age of Onset  . Cancer Father   . Diabetes Maternal Grandfather 90     ROS:  Please see the history of present illness.  All other ROS reviewed and negative.     Physical Exam/Data:   Vitals:   07/28/20 0730 07/28/20 0846 07/28/20 1006 07/28/20 1014  BP: (!) 159/95  (!) 199/113 (!) 147/108  Pulse: 70 64    Resp: 15 16    Temp:      TempSrc:      SpO2: 96% 97%    Weight:      Height:       No intake or output data in the 24 hours ending 07/28/20 1128 Last 3 Weights 07/28/2020 06/27/2018 11/21/2017  Weight (lbs) 218 lb 4.1 oz 218 lb 12.8 oz 204 lb  Weight (kg) 99 kg 99.247 kg 92.534 kg     Body mass index is 30.87 kg/m.  General: 86 y.o. male resting comfortably in no acute distress. HEENT: Normocephalic and atraumatic. Sclera clear. . Neck: Supple. No carotid bruits. No JVD. Heart: Irregularly irregular rhythm with normal rate. Distinct S1 and S2. No murmurs, gallops, or rubs.  Lungs: No increased work of breathing. Clear to ausculation bilaterally. No wheezes, rhonchi, or rales.  Abdomen: Soft, non-distended, and non-tender to palpation. Bowel sounds present. MSK: Normal strength and tone for age. Extremities: No lower extremity edema.    Skin: Warm and dry. Neuro: No focal deficits. Psych: Normal affect. Responds appropriately.   EKG:  The EKG was personally reviewed and demonstrates: Atrial fibrillation, rate 77 bpm, with RVR and non-specific ST/T changes.  Telemetry:  Telemetry was personally reviewed and demonstrates:  Unable to review telemetry in nuclear medicine.  Relevant CV Studies:  Echo pending.  Laboratory Data:  High Sensitivity Troponin:   Recent Labs  Lab 07/27/20 2347 07/28/20 0212 07/28/20 0545 07/28/20 0745  TROPONINIHS 10 9 8 8      Chemistry Recent Labs  Lab 07/27/20 2347  NA 141  K 4.0  CL 107  CO2 22   GLUCOSE 124*  BUN 16  CREATININE 1.12  CALCIUM 9.4  GFRNONAA >60  ANIONGAP 12    No results for input(s): PROT, ALBUMIN, AST,  ALT, ALKPHOS, BILITOT in the last 168 hours. Hematology Recent Labs  Lab 07/27/20 2347  WBC 11.1*  RBC 5.17  HGB 15.4  HCT 47.1  MCV 91.1  MCH 29.8  MCHC 32.7  RDW 13.2  PLT 222   BNPNo results for input(s): BNP, PROBNP in the last 168 hours.  DDimer No results for input(s): DDIMER in the last 168 hours.   Radiology/Studies:  DG Chest Port 1 View  Result Date: 07/28/2020 CLINICAL DATA:  Chest pain EXAM: PORTABLE CHEST 1 VIEW COMPARISON:  08/17/2017 FINDINGS: Extensive artifact from EKG leads. Prior median sternotomy for CABG. There is no edema, consolidation, effusion, or pneumothorax. Normal heart size and stable aortic contours IMPRESSION: No acute finding. Electronically Signed   By: Marnee Spring M.D.   On: 07/28/2020 05:02     Assessment and Plan:   Atrial Fibrillation - History of post-op atrial fibrillation that converted with Amiodarone. Patient presents in atrial fibrillation with controlled ventricular rate today. Asymptomatic with this. - Potassium 4.0.  - Will check Magnesium and TSH. - Echo pending. - Continue home Lopressor 25mg  twice daily. - CHA2DS2-VASc = 5 (CAD, HTN, DM, age x2). Continue Eliquis 5mg  twice daily. Can likely plan for outpatient DCCV with primary Cardiologist in [place] after 3 weeks of uninterrupted anticoagulation if he remains in atrial fibrillation.  CHA2DS2-VASc Score = 5  This indicates a 7.2% annual risk of stroke. The patient's score is based upon: CHF History: No HTN History: Yes Diabetes History: Yes Stroke History: No Vascular Disease History: Yes Age Score: 2 Gender Score: 0  CAD - History of CABG x5 in 2018. - EKG shows non-specific ST/T changes. - High-sensitivity troponin negative x2. - No chest pain.  - Can stop Aspirin given need for DOAC.  - Continue beta-blocker and  high-intensity statin.  Left Hand Pain - Don't think this was an anginal equivalent. Of note, admitting provider ordered stress test but patient declined this. - Pain has since resolved.  Hypertension - BP mildly elevated this morning but patient has not received morning medications. - Continue home Lopressor 25mg  twice daily.  - Can start ACEi/ARB if needed for additional BP control and renal protection given diabetes. Could start with Losartan 25mg  daily.  Hyperlipidemia - Lipid panel this admission: Total Cholesterol 150, Triglycerides 34, HDL 46, LDL 97.  - LDL goal <70 given.  - Currently on Lipitor 40mg  daily. Would consider increasing to 80mg  daily to get closer to LDL goal. Will defer to primary Cardiologist in [place].   Diabetes  - Management per primary team.   Mild Leukocytosis - Management per primary team.    For questions or updates, please contact [facility] Please consult [email or web address] for contact info under    Signed, 2019, PA-C  07/28/2020 11:28 AM   Attending Note:   The patient was seen and examined.  Agree with assessment and plan as noted above.  Changes made to the above note as needed.  Patient seen and independently examined with , PA .   We discussed all aspects of the encounter. I agree with the assessment and plan as stated above.  1.   Hand pain : Patient presents with some episodes of hand pain.  The pain is very atypical.  It is not similar to his previous episodes of angina which she had prior to his bypass surgery.  He had actual chest pain at that time.  His cardiac enzymes are negative.  His EKG is without  ST or T wave changes.  At this point I do not think that his hand pain needs any additional cardiac work-up.  His he may follow-up with his general medical doctor once he gets back to [place] and also will be following up with his cardiologist.  The patient was schedule for a lexiscan myoview but he  refused the study .    2.  Atrial fibrillation: The patient is found to have atrial fibrillation with a controlled ventricular response.  He was noted to have postoperative atrial fibrillation after his bypass surgery in 2018.  He is not been on any anticoagulation.  He is on metoprolol.  Eliquis has been started.  Echocardiogram reveals left ventricular systolic function is at the lower limits of normal. No significant valvular disease.  Magnesium level and TSH have been drawn and the results are pending.  At this point he is stable to be discharged from the [place].  We will let him eat.  He will follow up with his primary medical doctor and his cardiologist in [place] in the next several weeks once he gets home.     I have spent a total of 40 minutes with patient reviewing [place]  notes , telemetry, EKGs, labs and examining patient as well as establishing an assessment and plan that was discussed with the patient. > 50% of time was spent in direct patient care.    Vesta Mixer, Montez Hageman., MD, [facility] 07/28/2020, 1:06 PM [address],  [address] Office [phone] Pager [phone]

## 2020-07-28 NOTE — Discharge Summary (Signed)
Physician Discharge Summary  Cody Rivers LTJ:[id number] DOB: 1932-08-07 DOA: 07/27/2020  PCP: Joycelyn Rua, MD  Admit date: 07/27/2020 Discharge date: 07/28/2020  Admitted From: Home Disposition:  Home  Recommendations for Outpatient Follow-up:  1. Follow up with Cards in 1-2 weeks 2. Please obtain BMP/CBC in one week   Home Health:No Equipment/Devices:None  Discharge Condition:Stable CODE STATUS:Full Diet recommendation: Heart Healthy  Brief/Interim Summary: 86 y.o. male past medical history significant for essential hypertension, diabetes mellitus type 2 without complications, CAD status post bypass in 2018, also history of a flutter in 2010 is brought in by EMS for palpitations left arm pain and chest tightness   Discharge Diagnoses:  Principal Problem:   Chest pain with moderate risk for cardiac etiology Active Problems:   Type 2 diabetes mellitus with complication, without long-term current use of insulin (HCC)   Hyperlipidemia   HYPERTENSION, BENIGN   CAD (coronary artery disease)   Chest pain   S/P CABG x 6   AF (paroxysmal atrial fibrillation) (HCC)  Arm pain cold and numb due to a fib rate controlled: Troponin has remained flat, he does have a history of bypass in 2018.  HDL greater than 40 LDL less than 100 a mild leukocytosis BCR negative.  Sinus rhythm left axis deviation with T wave inversions in V1 to V3. We will continue to cycle his cardiac enzymes x3. Consult cardiology who agreed with management to start him on Eliquis stop the aspirin follow-up with cardiology in 3 weeks. Continue aspirin and metoprolol nitroglycerin as needed. [place] [facility] Eli[facility]  Type 2 diabetes mellitus with complication, without long-term current use of insulin (HCC): We will hold Metformin as were bringing him into the hospital, will start him on long-acting insulin and sliding scales check CBGs before meals and at bedtime.  Hyperlipidemia: Continue  statins.  HYPERTENSION, BENIGN Continue metoprolol.   Discharge Instructions  Discharge Instructions    Diet - low sodium heart healthy   Complete by: As directed    Increase activity slowly   Complete by: As directed      Allergies as of 07/28/2020      Reactions   Robitussin 12 Hour Cough [dextromethorphan Polistirex Er] Nausea And Vomiting      Medication List    STOP taking these medications   aspirin EC 81 MG tablet     TAKE these medications   acetaminophen 325 MG tablet Commonly known as: TYLENOL Take 2 tablets (650 mg total) by mouth every 6 (six) hours as needed for mild pain.   apixaban 5 MG Tabs tablet Commonly known as: ELIQUIS Take 1 tablet (5 mg total) by mouth 2 (two) times daily.   atorvastatin 40 MG tablet Commonly known as: LIPITOR Take 1 tablet (40 mg total) by mouth daily at 6 PM.   docusate sodium 100 MG capsule Commonly known as: COLACE Take 100 mg by mouth 2 (two) times daily.   glyBURIDE-metformin 2.5-500 MG tablet Commonly known as: GLUCOVANCE Take 1 tablet by mouth daily with breakfast.   levothyroxine 100 MCG tablet Commonly known as: SYNTHROID Take 1 tablet by mouth daily before breakfast.   metoprolol tartrate 25 MG tablet Commonly known as: LOPRESSOR Take 1 tablet (25 mg total) by mouth 2 (two) times daily.   multivitamin tablet Take 1 tablet by mouth daily.   polyethylene glycol 17 g packet Commonly known as: MIRALAX / GLYCOLAX Take 17 g by mouth daily.   PRESERVISION AREDS PO Take 1 tablet by mouth  daily.   tamsulosin 0.4 MG Caps capsule Commonly known as: FLOMAX Take 0.4 mg by mouth daily.   vitamin B-12 1000 MCG tablet Commonly known as: CYANOCOBALAMIN Take 1,000 mcg by mouth daily.       Allergies  Allergen Reactions  . Robitussin 12 Hour Cough [Dextromethorphan Polistirex Er] Nausea And Vomiting    Consultations:  Cardiology   Procedures/Studies: DG Chest Port 1 View  Result Date:  07/28/2020 CLINICAL DATA:  Chest pain EXAM: PORTABLE CHEST 1 VIEW COMPARISON:  08/17/2017 FINDINGS: Extensive artifact from EKG leads. Prior median sternotomy for CABG. There is no edema, consolidation, effusion, or pneumothorax. Normal heart size and stable aortic contours IMPRESSION: No acute finding. Electronically Signed   By: Marnee Spring M.D.   On: 07/28/2020 05:02   ECHOCARDIOGRAM COMPLETE  Result Date: 07/28/2020    ECHOCARDIOGRAM REPORT   Patient Name:   Cody Rivers Date of Exam: 07/28/2020 Medical Rec #:  [id number]      Height:       70.5 in Accession #:    [id number]     Weight:       218.3 lb Date of Birth:  1931-11-23       BSA:          2.178 m Patient Age:    86 years       BP:           159/95 mmHg Patient Gender: M              HR:           77 bpm. Exam Location:  Inpatient Procedure: 2D Echo, Cardiac Doppler and Color Doppler Indications:    R07.9* Chest pain, unspecified  History:        Patient has prior history of Echocardiogram examinations, most                 recent 07/16/2017. CAD, Arrythmias:Atrial Flutter; Risk                 Factors:Hypertension, Diabetes, Dyslipidemia and Hypthyroidism.  Sonographer:    Elmarie Shiley Dance Referring Phys: [id number] Dashun Borre FELIZ ORTIZ IMPRESSIONS  1. Left ventricular ejection fraction, by estimation, is 50 to 55%. The left ventricle has low normal function. The left ventricle has no regional wall motion abnormalities. Left ventricular diastolic function could not be evaluated.  2. Right ventricular systolic function is normal. The right ventricular size is normal.  3. Left atrial size was moderately dilated.  4. The mitral valve is abnormal. Trivial mitral valve regurgitation.  5. The aortic valve is tricuspid. Aortic valve regurgitation is not visualized. Mild aortic valve sclerosis is present, with no evidence of aortic valve stenosis.  6. The inferior vena cava is normal in size with greater than 50% respiratory variability, suggesting right atrial  pressure of 3 mmHg. Comparison(s): Changes from prior study are noted. 07/16/2017 ([facility]): LVEF 55-60%. FINDINGS  Left Ventricle: Left ventricular ejection fraction, by estimation, is 50 to 55%. The left ventricle has low normal function. The left ventricle has no regional wall motion abnormalities. The left ventricular internal cavity size was normal in size. There is borderline left ventricular hypertrophy. Left ventricular diastolic function could not be evaluated due to atrial flutter. Left ventricular diastolic function could not be evaluated. Right Ventricle: The right ventricular size is normal. No increase in right ventricular wall thickness. Right ventricular systolic function is normal. Left Atrium: Left atrial size was moderately dilated. Right Atrium: Right atrial size was normal  in size. Pericardium: There is no evidence of pericardial effusion. Mitral Valve: The mitral valve is abnormal. Mild to moderate mitral annular calcification. Trivial mitral valve regurgitation. Tricuspid Valve: The tricuspid valve is grossly normal. Tricuspid valve regurgitation is trivial. Aortic Valve: The aortic valve is tricuspid. Aortic valve regurgitation is not visualized. Mild aortic valve sclerosis is present, with no evidence of aortic valve stenosis. Pulmonic Valve: The pulmonic valve was normal in structure. Pulmonic valve regurgitation is not visualized. Aorta: The aortic root and ascending aorta are structurally normal, with no evidence of dilitation. Venous: The inferior vena cava is normal in size with greater than 50% respiratory variability, suggesting right atrial pressure of 3 mmHg. IAS/Shunts: No atrial level shunt detected by color flow Doppler.  LEFT VENTRICLE PLAX 2D LVIDd:         5.30 cm LVIDs:         3.60 cm LV PW:         1.10 cm LV IVS:        1.00 cm LVOT diam:     2.20 cm LV SV:         53 LV SV Index:   24 LVOT Area:     3.80 cm  RIGHT VENTRICLE            IVC RV Basal diam:  2.20 cm    IVC diam:  1.40 cm RV S prime:     6.31 cm/s TAPSE (M-mode): 1.7 cm LEFT ATRIUM             Index       RIGHT ATRIUM           Index LA diam:        4.70 cm 2.16 cm/m  RA Area:     18.00 cm LA Vol (A2C):   90.2 ml 41.42 ml/m RA Volume:   44.20 ml  20.30 ml/m LA Vol (A4C):   92.8 ml 42.61 ml/m LA Biplane Vol: 92.0 ml 42.25 ml/m  AORTIC VALVE LVOT Vmax:   69.80 cm/s LVOT Vmean:  46.000 cm/s LVOT VTI:    0.140 m  AORTA Ao Root diam: 3.60 cm Ao Asc diam:  3.60 cm MITRAL VALVE MV Area (PHT): 4.39 cm    SHUNTS MV Decel Time: 173 msec    Systemic VTI:  0.14 m MV E velocity: 81.00 cm/s  Systemic Diam: 2.20 cm MV A velocity: 37.80 cm/s MV E/A ratio:  2.14 Zoila Shutter MD Electronically signed by Zoila Shutter MD Signature Date/Time: 07/28/2020/11:55:51 AM    Final      Subjective: No new complaints.  Discharge Exam: Vitals:   07/28/20 1430 07/28/20 1445  BP: 116/72 119/88  Pulse: 74 65  Resp: 18 15  Temp:    SpO2: 96% 93%   Vitals:   07/28/20 1300 07/28/20 1400 07/28/20 1430 07/28/20 1445  BP:  130/80 116/72 119/88  Pulse: 72 (!) 131 74 65  Resp: 15 14 18 15   Temp:      TempSrc:      SpO2: 93%  96% 93%  Weight:      Height:        General: Pt is alert, awake, not in acute distress Cardiovascular: RRR, S1/S2 +, no rubs, no gallops Respiratory: CTA bilaterally, no wheezing, no rhonchi Abdominal: Soft, NT, ND, bowel sounds + Extremities: no edema, no cyanosis    The results of significant diagnostics from this hospitalization (including imaging, microbiology, ancillary and laboratory) are listed below for reference.  Microbiology: Recent Results (from the past 240 hour(s))  Resp Panel by RT-PCR (Flu A&B, Covid) Nasopharyngeal Swab     Status: None   Collection Time: 07/28/20  4:56 AM   Specimen: Nasopharyngeal Swab; Nasopharyngeal(NP) swabs in vial transport medium  Result Value Ref Range Status   SARS Coronavirus 2 by RT PCR NEGATIVE NEGATIVE Final    Comment: (NOTE) SARS-CoV-2  target nucleic acids are NOT DETECTED.  The SARS-CoV-2 RNA is generally detectable in upper respiratory specimens during the acute phase of infection. The lowest concentration of SARS-CoV-2 viral copies this assay can detect is 138 copies/mL. A negative result does not preclude SARS-Cov-2 infection and should not be used as the sole basis for treatment or other patient management decisions. A negative result may occur with  improper specimen collection/handling, submission of specimen other than nasopharyngeal swab, presence of viral mutation(s) within the areas targeted by this assay, and inadequate number of viral copies(<138 copies/mL). A negative result must be combined with clinical observations, patient history, and epidemiological information. The expected result is Negative.  Fact Sheet for Patients:  BloggerCourse.com  Fact Sheet for Healthcare Providers:  SeriousBroker.it  This test is no t yet approved or cleared by the [place] FDA and  has been authorized for detection and/or diagnosis of SARS-CoV-2 by FDA under an Emergency Use Authorization (EUA). This EUA will remain  in effect (meaning this test can be used) for the duration of the COVID-19 declaration under Section 564(b)(1) of the Act, 21 U.S.C.section 360bbb-3(b)(1), unless the authorization is terminated  or revoked sooner.       Influenza A by PCR NEGATIVE NEGATIVE Final   Influenza B by PCR NEGATIVE NEGATIVE Final    Comment: (NOTE) The Xpert Xpress SARS-CoV-2/FLU/RSV plus assay is intended as an aid in the diagnosis of influenza from Nasopharyngeal swab specimens and should not be used as a sole basis for treatment. Nasal washings and aspirates are unacceptable for Xpert Xpress SARS-CoV-2/FLU/RSV testing.  Fact Sheet for Patients: BloggerCourse.com  Fact Sheet for Healthcare  Providers: SeriousBroker.it  This test is not yet approved or cleared by the [place] FDA and has been authorized for detection and/or diagnosis of SARS-CoV-2 by FDA under an Emergency Use Authorization (EUA). This EUA will remain in effect (meaning this test can be used) for the duration of the COVID-19 declaration under Section 564(b)(1) of the Act, 21 U.S.C. section 360bbb-3(b)(1), unless the authorization is terminated or revoked.  Performed at [facility], [address] [place], [place] [address]      Labs: BNP (last 3 results) No results for input(s): BNP in the last 8760 hours. Basic Metabolic Panel: Recent Labs  Lab 07/27/20 2347  NA 141  K 4.0  CL 107  CO2 22  GLUCOSE 124*  BUN 16  CREATININE 1.12  CALCIUM 9.4   Liver Function Tests: No results for input(s): AST, ALT, ALKPHOS, BILITOT, PROT, ALBUMIN in the last 168 hours. No results for input(s): LIPASE, AMYLASE in the last 168 hours. No results for input(s): AMMONIA in the last 168 hours. CBC: Recent Labs  Lab 07/27/20 2347  WBC 11.1*  HGB 15.4  HCT 47.1  MCV 91.1  PLT 222   Cardiac Enzymes: No results for input(s): CKTOTAL, CKMB, CKMBINDEX, TROPONINI in the last 168 hours. BNP: Invalid input(s): POCBNP CBG: Recent Labs  Lab 07/28/20 0646 07/28/20 0801 07/28/20 1154  GLUCAP 141* 132* 89   D-Dimer No results for input(s): DDIMER in the last 72 hours.  Hgb A1c Recent Labs    07/28/20 0545  HGBA1C 6.2*   Lipid Profile Recent Labs    07/28/20 0545  CHOL 150  HDL 46  LDLCALC 97  TRIG 34  CHOLHDL 3.3   Thyroid function studies No results for input(s): TSH, T4TOTAL, T3FREE, THYROIDAB in the last 72 hours.  Invalid input(s): FREET3 Anemia work up No results for input(s): VITAMINB12, FOLATE, FERRITIN, TIBC, IRON, RETICCTPCT in the last 72 hours. Urinalysis    Component Value Date/Time   COLORURINE YELLOW 07/28/2020 0800   APPEARANCEUR HAZY (A)  07/28/2020 0800   LABSPEC 1.021 07/28/2020 0800   PHURINE 5.0 07/28/2020 0800   GLUCOSEU NEGATIVE 07/28/2020 0800   HGBUR NEGATIVE 07/28/2020 0800   BILIRUBINUR NEGATIVE 07/28/2020 0800   KETONESUR NEGATIVE 07/28/2020 0800   PROTEINUR NEGATIVE 07/28/2020 0800   NITRITE NEGATIVE 07/28/2020 0800   LEUKOCYTESUR NEGATIVE 07/28/2020 0800   Sepsis Labs Invalid input(s): PROCALCITONIN,  WBC,  LACTICIDVEN Microbiology Recent Results (from the past 240 hour(s))  Resp Panel by RT-PCR (Flu A&B, Covid) Nasopharyngeal Swab     Status: None   Collection Time: 07/28/20  4:56 AM   Specimen: Nasopharyngeal Swab; Nasopharyngeal(NP) swabs in vial transport medium  Result Value Ref Range Status   SARS Coronavirus 2 by RT PCR NEGATIVE NEGATIVE Final    Comment: (NOTE) SARS-CoV-2 target nucleic acids are NOT DETECTED.  The SARS-CoV-2 RNA is generally detectable in upper respiratory specimens during the acute phase of infection. The lowest concentration of SARS-CoV-2 viral copies this assay can detect is 138 copies/mL. A negative result does not preclude SARS-Cov-2 infection and should not be used as the sole basis for treatment or other patient management decisions. A negative result may occur with  improper specimen collection/handling, submission of specimen other than nasopharyngeal swab, presence of viral mutation(s) within the areas targeted by this assay, and inadequate number of viral copies(<138 copies/mL). A negative result must be combined with clinical observations, patient history, and epidemiological information. The expected result is Negative.  Fact Sheet for Patients:  BloggerCourse.comhttps://www.fda.gov/media/152166/download  Fact Sheet for Healthcare Providers:  SeriousBroker.ithttps://www.fda.gov/media/152162/download  This test is no t yet approved or cleared by the [place] FDA and  has been authorized for detection and/or diagnosis of SARS-CoV-2 by FDA under an Emergency Use Authorization (EUA).  This EUA will remain  in effect (meaning this test can be used) for the duration of the COVID-19 declaration under Section 564(b)(1) of the Act, 21 U.S.C.section 360bbb-3(b)(1), unless the authorization is terminated  or revoked sooner.       Influenza A by PCR NEGATIVE NEGATIVE Final   Influenza B by PCR NEGATIVE NEGATIVE Final    Comment: (NOTE) The Xpert Xpress SARS-CoV-2/FLU/RSV plus assay is intended as an aid in the diagnosis of influenza from Nasopharyngeal swab specimens and should not be used as a sole basis for treatment. Nasal washings and aspirates are unacceptable for Xpert Xpress SARS-CoV-2/FLU/RSV testing.  Fact Sheet for Patients: BloggerCourse.comhttps://www.fda.gov/media/152166/download  Fact Sheet for Healthcare Providers: SeriousBroker.ithttps://www.fda.gov/media/152162/download  This test is not yet approved or cleared by the [place] FDA and has been authorized for detection and/or diagnosis of SARS-CoV-2 by FDA under an Emergency Use Authorization (EUA). This EUA will remain in effect (meaning this test can be used) for the duration of the COVID-19 declaration under Section 564(b)(1) of the Act, 21 U.S.C. section 360bbb-3(b)(1), unless the authorization is terminated or revoked.  Performed at [facility], [address] [place], [place] [id number]  Time coordinating discharge: Over 30 minutes  SIGNED:   Marinda Elk, MD  [facility] 07/28/2020, 3:02 PM Pager   If 7PM-7AM, please contact night-coverage [email or web address] Password [email or web address]

## 2020-07-28 NOTE — ED Provider Notes (Addendum)
[facility] EMERGENCY DEPARTMENT Provider Note   CSN: [id number] Arrival date & time: 07/27/20  2342     History Chief Complaint  Patient presents with  . Arm Pain  . Atrial Fibrillation    Cody Rivers is a 86 y.o. male.  Patient presents to the emergency department with complaints of arm pain and heart palpitations.  Patient does have a history of coronary artery disease, status post bypass surgery in 2018.  EMS was called to the home tonight for the symptoms.  He was given aspirin and has had resolution of the pain.  EMS found the patient to be in atrial fibrillation.  Patient apparently does have a history of A. fib but is not currently anticoagulated because he has not had A. fib in years since he has had an ablation.        Past Medical History:  Diagnosis Date  . Arthritis   . Atrial flutter (HCC) 11/20/2008   Qualifier: Diagnosis of  By: Johney Frame, MD, Fayrene Fearing    . Chest pain with moderate risk for cardiac etiology 07/14/2017  . Coronary artery disease   . Diabetes mellitus without complication (HCC)   . Hyperlipidemia   . Hypertension     Patient Active Problem List   Diagnosis Date Noted  . 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  . Abnormal nuclear stress test 07/15/2017  . Chest pain 07/15/2017  . CAD (coronary artery disease)   . Hypertension 07/14/2017  . Chest pain with moderate risk for cardiac etiology 07/14/2017  . Type 2 diabetes mellitus with complication, without long-term current use of insulin (HCC) 12/03/2008  . Hyperlipidemia 12/03/2008  . ALLERGIC RHINITIS 12/03/2008  . BENIGN PROSTATIC HYPERTROPHY, WITH URINARY OBSTRUCTION 12/03/2008  . DEGENERATIVE JOINT DISEASE 12/03/2008  . HYPERTENSION, BENIGN 11/20/2008  . Atrial flutter [facility]), s/p ablation 2010 11/20/2008    Past Surgical History:  Procedure Laterality Date  . ATRIAL FLUTTER ABLATION  2010   Dr Johney Frame  . CARDIAC SURGERY    . CORONARY ARTERY BYPASS GRAFT N/A 07/16/2017    Procedure: CORONARY ARTERY BYPASS GRAFTING (CABG) TIMES SIX  USING RIGHT AND LEFT  INTERNAL MAMMARY ARTERIES  AND RIGHT AND LEFT  SAPHENOUS VEIN HARVESTED ENDOSCOPICALLY;  Surgeon: Alleen Borne, MD;  Location: [facility] OR;  Service: Open Heart Surgery;  Laterality: N/A;  . LEFT HEART CATH AND CORONARY ANGIOGRAPHY N/A 07/15/2017   Procedure: LEFT HEART CATH AND CORONARY ANGIOGRAPHY;  Surgeon: Tonny Bollman, MD;  Location: [facility] INVASIVE CV LAB;  Service: Cardiovascular;  Laterality: N/A;  . REPLACEMENT TOTAL KNEE BILATERAL    . ROTATOR CUFF REPAIR     LEFT  . TEE WITHOUT CARDIOVERSION N/A 07/16/2017   Procedure: TRANSESOPHAGEAL ECHOCARDIOGRAM (TEE);  Surgeon: Alleen Borne, MD;  Location: [facility] OR;  Service: Open Heart Surgery;  Laterality: N/A;       Family History  Problem Relation Age of Onset  . Cancer Father   . Diabetes Maternal Grandfather 59    Social History   Tobacco Use  . Smoking status: Never Smoker  . Smokeless tobacco: Never Used  Vaping Use  . Vaping Use: Never used  Substance Use Topics  . Alcohol use: Yes  . Drug use: No    Home Medications Prior to Admission medications   Medication Sig Start Date End Date Taking? Authorizing Provider  acetaminophen (TYLENOL) 325 MG tablet Take 2 tablets (650 mg total) by mouth every 6 (six) hours as needed for mild pain. 07/23/17  Doree Fudge M, PA-C  aspirin 81 MG tablet Take 1 tablet (81 mg total) by mouth daily. 11/21/17   Tonny Bollman, MD  atorvastatin (LIPITOR) 40 MG tablet Take 1 tablet (40 mg total) by mouth daily at 6 PM. 07/31/18   Tonny Bollman, MD  calcium carbonate (TUMS - DOSED IN MG ELEMENTAL CALCIUM) 500 MG chewable tablet Chew 1 tablet by mouth daily.    [provider]  cephALEXin (KEFLEX) 500 MG capsule Take 1 capsule (500 mg total) by mouth 3 (three) times daily. 08/08/17   Raeford Razor, MD  docusate sodium (COLACE) 100 MG capsule Take 100 mg by mouth 2 (two) times daily.    [provider]  glipiZIDE-metformin (METAGLIP) 2.5-500 MG tablet Take 2 tablets by mouth. 10/30/17   [provider]  glyBURIDE-metformin (GLUCOVANCE) 2.5-500 MG tablet Take 1 tablet by mouth daily with breakfast.    [provider]  levothyroxine (SYNTHROID, LEVOTHROID) 100 MCG tablet Take 1 tablet by mouth once. 10/01/17   [provider]  metoprolol tartrate (LOPRESSOR) 25 MG tablet Take 1 tablet (25 mg total) by mouth 2 (two) times daily. 07/31/18   Tonny Bollman, MD  Multiple Vitamin (MULTIVITAMIN) tablet Take 1 tablet by mouth daily.    [provider]  Multiple Vitamins-Minerals (PRESERVISION AREDS PO) Take 1 tablet by mouth daily.    [provider]  oxybutynin (DITROPAN) 5 MG tablet Take 5 mg by mouth 3 (three) times daily.    [provider]  polyethylene glycol (MIRALAX / GLYCOLAX) packet Take 17 g by mouth daily.    [provider]  tamsulosin (FLOMAX) 0.4 MG CAPS capsule Take 0.4 mg by mouth daily.    [provider]  vitamin B-12 (CYANOCOBALAMIN) 1000 MCG tablet Take 1,000 mcg by mouth daily.    [provider]    Allergies    Robitussin 12 hour cough [dextromethorphan polistirex er]  Review of Systems   Review of Systems  Cardiovascular: Positive for chest pain.  All other systems reviewed and are negative.   Physical Exam Updated Vital Signs BP (!) 142/87   Pulse 78   Temp 98.7 F (37.1 C) (Oral)   Resp 15   SpO2 96%   Physical Exam Vitals and nursing note reviewed.  Constitutional:      General: He is not in acute distress.    Appearance: Normal appearance. He is well-developed and well-nourished.  HENT:     Head: Normocephalic and atraumatic.     Right Ear: Hearing normal.     Left Ear: Hearing normal.     Nose: Nose normal.     Mouth/Throat:     Mouth: Oropharynx is clear and moist and mucous membranes are normal.  Eyes:     Extraocular Movements: EOM normal.      Conjunctiva/sclera: Conjunctivae normal.     Pupils: Pupils are equal, round, and reactive to light.  Cardiovascular:     Rate and Rhythm: Rhythm irregular.     Heart sounds: S1 normal and S2 normal. No murmur heard. No friction rub. No gallop.   Pulmonary:     Effort: Pulmonary effort is normal. No respiratory distress.     Breath sounds: Normal breath sounds.  Chest:     Chest wall: No tenderness.  Abdominal:     General: Bowel sounds are normal.     Palpations: Abdomen is soft. There is no hepatosplenomegaly.     Tenderness: There is no abdominal tenderness. There is no  guarding or rebound. Negative signs include Murphy's sign and McBurney's sign.     Hernia: No hernia is present.  Musculoskeletal:        General: Normal range of motion.     Cervical back: Normal range of motion and neck supple.  Skin:    General: Skin is warm, dry and intact.     Findings: No rash.     Nails: There is no cyanosis.  Neurological:     Mental Status: He is alert and oriented to person, place, and time.     GCS: GCS eye subscore is 4. GCS verbal subscore is 5. GCS motor subscore is 6.     Cranial Nerves: No cranial nerve deficit.     Sensory: No sensory deficit.     Coordination: Coordination normal.     Deep Tendon Reflexes: Strength normal.  Psychiatric:        Mood and Affect: Mood and affect normal.        Speech: Speech normal.        Behavior: Behavior normal.        Thought Content: Thought content normal.     ED Results / Procedures / Treatments   Labs (all labs ordered are listed, but only abnormal results are displayed) Labs Reviewed  BASIC METABOLIC PANEL - Abnormal; Notable for the following components:      Result Value   Glucose, Bld 124 (*)    All other components within normal limits  CBC - Abnormal; Notable for the following components:   WBC 11.1 (*)    All other components within normal limits  URINE CULTURE  RESP PANEL BY RT-PCR (FLU A&B, COVID) [id number]   URINALYSIS, ROUTINE W REFLEX MICROSCOPIC  TROPONIN I (HIGH SENSITIVITY)  TROPONIN I (HIGH SENSITIVITY)    EKG EKG Interpretation  Date/Time:  Sunday July 27 2020 23:40:17 EST Ventricular Rate:  77 PR Interval:  174 QRS Duration: 90 QT Interval:  378 QTC Calculation: 427 R Axis:   5 Text Interpretation: Atrial fibrillation Nonspecific ST and T wave abnormality Abnormal ECG Reconfirmed by Gilda CreasePollina, Tyge Somers J [phone]([id number]) on 07/28/2020 5:44:05 AM   Radiology No results found.  Procedures Procedures (including critical care time)  Medications Ordered in ED Medications - No data to display  ED Course  I have reviewed the triage vital signs and the nursing notes.  Pertinent labs & imaging results that were available during my care of the patient were reviewed by me and considered in my medical decision making (see chart for details).    MDM Rules/Calculators/A&P                          Patient brought to the emergency department for arm pain concerning for anginal equivalent.  Reviewing his cardiology records reveals that he was seen last month and is scheduled for an outpatient Myoview because of recurrent anginal chest pain.  This has not occurred yet.  Additionally, he has found to be in atrial fibrillation today.  Although he does have a history of A. fib, he has not had any occurrences for years because he has had previous ablation.  He is not currently anticoagulated.  CHA2DS2-VASc score calculated to be 5 today.  Will initiate anticoagulation.  He is currently on Lopressor and this is controlling his rate.  CRITICAL CARE Performed by: Gilda Creasehristopher J Letizia Hook   Total critical care time: 30 minutes  Critical care time was exclusive of separately billable procedures  and treating other patients.  Critical care was necessary to treat or prevent imminent or life-threatening deterioration.  Critical care was time spent personally by me on the following activities:  development of treatment plan with patient and/or surrogate as well as nursing, discussions with consultants, evaluation of patient's response to treatment, examination of patient, obtaining history from patient or surrogate, ordering and performing treatments and interventions, ordering and review of laboratory studies, ordering and review of radiographic studies, pulse oximetry and re-evaluation of patient's condition.   Final Clinical Impression(s) / ED Diagnoses Final diagnoses:  Paroxysmal atrial fibrillation (HCC)  Chest pain, unspecified type    Rx / DC Orders ED Discharge Orders    None       Kavir Savoca, Canary Brim, MD 07/28/20 3267    Gilda Crease, MD 07/28/20 [phone]

## 2020-07-28 NOTE — ED Notes (Signed)
Patient's son at bedside.

## 2020-07-28 NOTE — ED Notes (Signed)
Patient and daughter state cardiology is discharging patient, asking when they can leave. Daughter requesting update from MD. MD paged.

## 2020-07-28 NOTE — Progress Notes (Signed)
ANTICOAGULATION CONSULT NOTE - Initial Consult  Pharmacy Consult for Heparin  Indication: chest pain/ACS and atrial fibrillation  Allergies  Allergen Reactions  . Robitussin 12 Hour Cough [Dextromethorphan Polistirex Er] Nausea And Vomiting    Vital Signs: Temp: 98.7 F (37.1 C) (12/19 2343) Temp Source: Oral (12/19 2343) BP: 159/95 (12/20 0730) Pulse Rate: 70 (12/20 0730)  Labs: Recent Labs    07/27/20 2347 07/28/20 0212 07/28/20 0545  HGB 15.4  --   --   HCT 47.1  --   --   PLT 222  --   --   CREATININE 1.12  --   --   TROPONINIHS 10 9 8     Estimated Creatinine Clearance: 56.3 mL/min (by C-G formula based on SCr of 1.12 mg/dL).   Medical History: Past Medical History:  Diagnosis Date  . Arthritis   . Atrial flutter (HCC) 11/20/2008   Qualifier: Diagnosis of  By: 11/22/2008, MD, Johney Frame    . Chest pain with moderate risk for cardiac etiology 07/14/2017  . Coronary artery disease   . Diabetes mellitus without complication (HCC)   . Dysrhythmia   . Hyperlipidemia   . Hypertension   . Hypothyroidism      Assessment: 86 y/o M with arm pain and afib. Hx of afib but no anti-coagulation at home. CBC/renal function good.   Now to transition from heparin gtt to Eliquis  Goal of Therapy:  Heparin level 0.3-0.7 units/ml Monitor platelets by anticoagulation protocol: Yes   Plan:  Stop heparin gtt Start Eliquis 5mg  PO BID Monitor renal function, s/s bleeding  88, PharmD Clinical Pharmacist ED Pharmacist Phone # [phone] 07/28/2020 7:55 AM

## 2020-07-28 NOTE — Progress Notes (Signed)
  Echocardiogram 2D Echocardiogram has been performed.  Cody Rivers Cody Rivers 07/28/2020, 8:35 AM

## 2020-07-28 NOTE — Progress Notes (Signed)
[facility] PROGRESS NOTE    Progress Note  Cody Rivers  ZRA:[id number] DOB: 30-Aug-1931 DOA: 07/27/2020 PCP: Joycelyn Rua, MD     Brief Narrative:   Cody Rivers is an 86 y.o. male past medical history significant for essential hypertension, diabetes mellitus type 2 without complications, CAD status post bypass in 2018, also history of a flutter in 2010 is brought in by EMS for palpitations left arm pain and chest tightness  Assessment/Plan:   Arm pain cold and numb due to a fib rate controlled: Troponin has remained flat, he does have a history of bypass in 2018.  HDL greater than 40 LDL less than 100 a mild leukocytosis BCR negative.  Sinus rhythm left axis deviation with T wave inversions in V1 to V3. We will continue to cycle his cardiac enzymes x3, check a 2D echo. Consult cardiology as we will need a stress test. Continue aspirin and metoprolol nitroglycerin as needed. [place] VAsc of 4 will start Eliquis  Type 2 diabetes mellitus with complication, without long-term current use of insulin (HCC): We will hold Metformin as were bringing him into the hospital, will start him on long-acting insulin and sliding scales check CBGs before meals and at bedtime. Check a hemoglobin A1c.  Hyperlipidemia: Continue statins.  HYPERTENSION, BENIGN Continue metoprolol.  AF (paroxysmal atrial fibrillation) (HCC) Currently rate controlled, Toprol.  DVT prophylaxis: heparin Family Communication:son Status is: Observation  The patient remains OBS appropriate and will d/c before 2 midnights.  Dispo: The patient is from: Home              Anticipated d/c is to: Home              Anticipated d/c date is: 2 days              Patient currently is not medically stable to d/c.        Code Status:     Code Status Orders  (From admission, onward)         Start     Ordered   07/28/20 0533  Full code  Continuous        07/28/20 0532        Code Status History     Date Active Date Inactive Code Status Order ID Comments User Context   07/14/2017 1302 07/23/2017 1735 Full Code [id number]  [facility]   Advance Care Planning Activity        IV Access:    Peripheral IV   Procedures and diagnostic studies:   DG Chest Port 1 View  Result Date: 07/28/2020 CLINICAL DATA:  Chest pain EXAM: PORTABLE CHEST 1 VIEW COMPARISON:  08/17/2017 FINDINGS: Extensive artifact from EKG leads. Prior median sternotomy for CABG. There is no edema, consolidation, effusion, or pneumothorax. Normal heart size and stable aortic contours IMPRESSION: No acute finding. Electronically Signed   By: Marnee Spring M.D.   On: 07/28/2020 05:02     Medical Consultants:    None.  Anti-Infectives:   none  Subjective:    Wallace Cullens relates he had arm and hand pain  Objective:    Vitals:   07/28/20 0615 07/28/20 0630 07/28/20 0640 07/28/20 0700  BP: (!) 148/92 (!) 153/94  (!) 152/93  Pulse: 68 72  73  Resp: 14 14  16   Temp:      TempSrc:      SpO2: 94% 93%  92%  Weight:   99 kg   Height:   5'  10.5" (1.791 m)    SpO2: 92 %  No intake or output data in the 24 hours ending 07/28/20 0720 Filed Weights   07/28/20 0640  Weight: 99 kg    Exam: General exam: In no acute distress. Respiratory system: Good air movement and clear to auscultation. Cardiovascular system: S1 & S2 heard, RRR. No JVD. Gastrointestinal system: Abdomen is nondistended, soft and nontender.  Extremities: No pedal edema. Skin: No rashes, lesions or ulcers Psychiatry: Judgement and insight appear normal. Mood & affect appropriate.    Data Reviewed:    Labs: Basic Metabolic Panel: Recent Labs  Lab 07/27/20 2347  NA 141  K 4.0  CL 107  CO2 22  GLUCOSE 124*  BUN 16  CREATININE 1.12  CALCIUM 9.4   GFR Estimated Creatinine Clearance: 56.3 mL/min (by C-G formula based on SCr of 1.12 mg/dL). Liver Function Tests: No results for input(s): AST, ALT, ALKPHOS,  BILITOT, PROT, ALBUMIN in the last 168 hours. No results for input(s): LIPASE, AMYLASE in the last 168 hours. No results for input(s): AMMONIA in the last 168 hours. Coagulation profile No results for input(s): INR, PROTIME in the last 168 hours. COVID-19 Labs  No results for input(s): DDIMER, FERRITIN, LDH, CRP in the last 72 hours.  Lab Results  Component Value Date   SARSCOV2NAA NEGATIVE 07/28/2020    CBC: Recent Labs  Lab 07/27/20 2347  WBC 11.1*  HGB 15.4  HCT 47.1  MCV 91.1  PLT 222   Cardiac Enzymes: No results for input(s): CKTOTAL, CKMB, CKMBINDEX, TROPONINI in the last 168 hours. BNP (last 3 results) No results for input(s): PROBNP in the last 8760 hours. CBG: Recent Labs  Lab 07/28/20 0646  GLUCAP 141*   D-Dimer: No results for input(s): DDIMER in the last 72 hours. Hgb A1c: Recent Labs    07/28/20 0545  HGBA1C 6.2*   Lipid Profile: Recent Labs    07/28/20 0545  CHOL 150  HDL 46  LDLCALC 97  TRIG 34  CHOLHDL 3.3   Thyroid function studies: No results for input(s): TSH, T4TOTAL, T3FREE, THYROIDAB in the last 72 hours.  Invalid input(s): FREET3 Anemia work up: No results for input(s): VITAMINB12, FOLATE, FERRITIN, TIBC, IRON, RETICCTPCT in the last 72 hours. Sepsis Labs: Recent Labs  Lab 07/27/20 2347  WBC 11.1*   Microbiology Recent Results (from the past 240 hour(s))  Resp Panel by RT-PCR (Flu A&B, Covid) Nasopharyngeal Swab     Status: None   Collection Time: 07/28/20  4:56 AM   Specimen: Nasopharyngeal Swab; Nasopharyngeal(NP) swabs in vial transport medium  Result Value Ref Range Status   SARS Coronavirus 2 by RT PCR NEGATIVE NEGATIVE Final    Comment: (NOTE) SARS-CoV-2 target nucleic acids are NOT DETECTED.  The SARS-CoV-2 RNA is generally detectable in upper respiratory specimens during the acute phase of infection. The lowest concentration of SARS-CoV-2 viral copies this assay can detect is 138 copies/mL. A negative result  does not preclude SARS-Cov-2 infection and should not be used as the sole basis for treatment or other patient management decisions. A negative result may occur with  improper specimen collection/handling, submission of specimen other than nasopharyngeal swab, presence of viral mutation(s) within the areas targeted by this assay, and inadequate number of viral copies(<138 copies/mL). A negative result must be combined with clinical observations, patient history, and epidemiological information. The expected result is Negative.  Fact Sheet for Patients:  BloggerCourse.com  Fact Sheet for Healthcare Providers:  SeriousBroker.it  This test is  no t yet approved or cleared by the [place] and  has been authorized for detection and/or diagnosis of SARS-CoV-2 by FDA under an Emergency Use Authorization (EUA). This EUA will remain  in effect (meaning this test can be used) for the duration of the COVID-19 declaration under Section 564(b)(1) of the Act, 21 U.S.C.section 360bbb-3(b)(1), unless the authorization is terminated  or revoked sooner.       Influenza A by PCR NEGATIVE NEGATIVE Final   Influenza B by PCR NEGATIVE NEGATIVE Final    Comment: (NOTE) The Xpert Xpress SARS-CoV-2/FLU/RSV plus assay is intended as an aid in the diagnosis of influenza from Nasopharyngeal swab specimens and should not be used as a sole basis for treatment. Nasal washings and aspirates are unacceptable for Xpert Xpress SARS-CoV-2/FLU/RSV testing.  Fact Sheet for Patients: BloggerCourse.com  Fact Sheet for Healthcare Providers: SeriousBroker.it  This test is not yet approved or cleared by the [place] FDA and has been authorized for detection and/or diagnosis of SARS-CoV-2 by FDA under an Emergency Use Authorization (EUA). This EUA will remain in effect (meaning this test can be used) for  the duration of the COVID-19 declaration under Section 564(b)(1) of the Act, 21 U.S.C. section 360bbb-3(b)(1), unless the authorization is terminated or revoked.  Performed at [facility], [address], [place], [place] [address]      Medications:   . aspirin EC  81 mg Oral Daily  . atorvastatin  40 mg Oral q1800  . insulin aspart  0-5 Units Subcutaneous QHS  . insulin aspart  0-9 Units Subcutaneous TID WC  . levothyroxine  100 mcg Oral Q0600  . metFORMIN  500 mg Oral BID WC  . metoprolol tartrate  25 mg Oral BID  . oxybutynin  5 mg Oral TID  . tamsulosin  0.4 mg Oral Daily   Continuous Infusions: . heparin 1,200 Units/hr (07/28/20 0503)      LOS: 0 days   Marinda Elk  [facility]  07/28/2020, 7:20 AM

## 2020-07-28 NOTE — Progress Notes (Signed)
ANTICOAGULATION CONSULT NOTE - Initial Consult  Pharmacy Consult for Heparin  Indication: chest pain/ACS and atrial fibrillation  Allergies  Allergen Reactions  . Robitussin 12 Hour Cough [Dextromethorphan Polistirex Er] Nausea And Vomiting    Vital Signs: Temp: 98.7 F (37.1 C) (12/19 2343) Temp Source: Oral (12/19 2343) BP: 149/88 (12/20 0430) Pulse Rate: 72 (12/20 0430)  Labs: Recent Labs    07/27/20 2347 07/28/20 0212  HGB 15.4  --   HCT 47.1  --   PLT 222  --   CREATININE 1.12  --   TROPONINIHS 10 9    CrCl cannot be calculated (Unknown ideal weight.).   Medical History: Past Medical History:  Diagnosis Date  . Arthritis   . Atrial flutter (HCC) 11/20/2008   Qualifier: Diagnosis of  By: Johney Frame, MD, Fayrene Fearing    . Chest pain with moderate risk for cardiac etiology 07/14/2017  . Coronary artery disease   . Diabetes mellitus without complication (HCC)   . Hyperlipidemia   . Hypertension      Assessment: 86 y/o M with arm pain and afib. Hx of afib but no anti-coagulation at home. CBC/renal function good.   Goal of Therapy:  Heparin level 0.3-0.7 units/ml Monitor platelets by anticoagulation protocol: Yes   Plan:  Heparin 4000 units bolus Start heparin drip at 1200 units/hr 1300 heparin level Daily CBC/heparin level Monitor for bleeding  Abran Duke, PharmD, BCPS Clinical Pharmacist Phone: [phone]

## 2020-07-28 NOTE — ED Notes (Signed)
Pt discharge instructions and prescriptions reviewed with the patient. The patient and family verbalized understanding of both. Pt discharged.

## 2020-07-29 LAB — URINE CULTURE: Culture: <10000 — AB

## 2021-06-14 ENCOUNTER — Other Ambulatory Visit: Payer: Self-pay

## 2021-06-14 ENCOUNTER — Emergency Department (HOSPITAL_COMMUNITY): Payer: Medicare HMO

## 2021-06-14 ENCOUNTER — Encounter (HOSPITAL_COMMUNITY): Payer: Self-pay

## 2021-06-14 ENCOUNTER — Emergency Department (HOSPITAL_COMMUNITY)
Admission: EM | Admit: 2021-06-14 | Discharge: 2021-06-14 | Disposition: A | Discharge status: Home / Self Care | Payer: Medicare HMO | Attending: Emergency Medicine | Admitting: Emergency Medicine

## 2021-06-14 DIAGNOSIS — E039 Hypothyroidism, unspecified: Secondary | ICD-10-CM | POA: Diagnosis not present

## 2021-06-14 DIAGNOSIS — R079 Chest pain, unspecified: Secondary | ICD-10-CM

## 2021-06-14 DIAGNOSIS — E119 Type 2 diabetes mellitus without complications: Secondary | ICD-10-CM | POA: Insufficient documentation

## 2021-06-14 DIAGNOSIS — R072 Precordial pain: Secondary | ICD-10-CM | POA: Diagnosis present

## 2021-06-14 DIAGNOSIS — I2510 Atherosclerotic heart disease of native coronary artery without angina pectoris: Secondary | ICD-10-CM | POA: Insufficient documentation

## 2021-06-14 DIAGNOSIS — I119 Hypertensive heart disease without heart failure: Secondary | ICD-10-CM | POA: Insufficient documentation

## 2021-06-14 DIAGNOSIS — Z951 Presence of aortocoronary bypass graft: Secondary | ICD-10-CM | POA: Diagnosis not present

## 2021-06-14 DIAGNOSIS — Z96653 Presence of artificial knee joint, bilateral: Secondary | ICD-10-CM | POA: Insufficient documentation

## 2021-06-14 DIAGNOSIS — Z7901 Long term (current) use of anticoagulants: Secondary | ICD-10-CM | POA: Insufficient documentation

## 2021-06-14 DIAGNOSIS — Z79899 Other long term (current) drug therapy: Secondary | ICD-10-CM | POA: Insufficient documentation

## 2021-06-14 DIAGNOSIS — I4891 Unspecified atrial fibrillation: Secondary | ICD-10-CM | POA: Diagnosis not present

## 2021-06-14 LAB — CBC
HCT: 44.4 % (ref 39.0–52.0)
Hemoglobin: 14.7 g/dL (ref 13.0–17.0)
MCH: 30.1 pg (ref 26.0–34.0)
MCHC: 33.1 g/dL (ref 30.0–36.0)
MCV: 90.8 fL (ref 80.0–100.0)
Platelets: 205 10*3/uL (ref 150–400)
RBC: 4.89 MIL/uL (ref 4.22–5.81)
RDW: 13.1 % (ref 11.5–15.5)
WBC: 9.8 10*3/uL (ref 4.0–10.5)
nRBC: 0 % (ref 0.0–0.2)

## 2021-06-14 LAB — BASIC METABOLIC PANEL
Anion gap: 9 (ref 5–15)
BUN: 17 mg/dL (ref 8–23)
CO2: 26 mmol/L (ref 22–32)
Calcium: 9.3 mg/dL (ref 8.9–10.3)
Chloride: 106 mmol/L (ref 98–111)
Creatinine, Ser: 1.13 mg/dL (ref 0.61–1.24)
GFR, Estimated: >60 mL/min (ref >60)
Glucose, Bld: 174 mg/dL — ABNORMAL HIGH (ref 70–99)
Potassium: 4 mmol/L (ref 3.5–5.1)
Sodium: 141 mmol/L (ref 135–145)

## 2021-06-14 LAB — TROPONIN I (HIGH SENSITIVITY)
Troponin I (High Sensitivity): 9 ng/L (ref <18)
Troponin I (High Sensitivity): 11 ng/L (ref <18)

## 2021-06-14 NOTE — ED Provider Notes (Signed)
[facility] Provider Note   CSN: [id number] Arrival date & time: 06/14/21  1755     History Chief Complaint  Patient presents with   Chest Pain    [facility] is a 87 y.o. male.  87 year old male with prior medical history as detailed below presents for evaluation.  Patient is accompanied by his son.  Patient reports that around 5 PM he had an episode of chest discomfort.  Symptoms lasted approximately 30 minutes.  He is now pain-free.  He denies any other complaint.  Patient does have history of prior CABG, A. fib, on Eliquis.  Patient's current primary cardiology team is in [facility] where he resides most of the time.  He apparently is here in [place] on an extended visit and he will be here through the end of the month.  The history is provided by the patient and a relative.  Chest Pain Pain location:  Substernal area Pain quality: aching   Pain radiates to:  Does not radiate Pain severity:  Mild Onset quality:  Sudden Duration:  30 minutes Timing:  Rare Progression:  Resolved Chronicity:  New     Past Medical History:  Diagnosis Date   Arthritis    Atrial flutter ([place]) 11/20/2008   Qualifier: Diagnosis of  By: Rayann Heman, MD, James     Chest pain with moderate risk for cardiac etiology 07/14/2017   Coronary artery disease    Diabetes mellitus without complication ([place])    Dysrhythmia    Hyperlipidemia    Hypertension    Hypothyroidism     Patient Active Problem List   Diagnosis Date Noted   AF (paroxysmal atrial fibrillation) ([place]) 07/28/2020   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   Abnormal nuclear stress test 07/15/2017   Chest pain 07/15/2017   CAD (coronary artery disease)    Hypertension 07/14/2017   Chest pain with moderate risk for cardiac etiology 07/14/2017   Type 2 diabetes mellitus with complication, without long-term current use of insulin ([place]) 12/03/2008   Hyperlipidemia 12/03/2008   ALLERGIC  RHINITIS 12/03/2008   BENIGN PROSTATIC HYPERTROPHY, WITH URINARY OBSTRUCTION 12/03/2008   DEGENERATIVE JOINT DISEASE 12/03/2008   HYPERTENSION, BENIGN 11/20/2008   Atrial flutter ([place]), s/p ablation 2010 11/20/2008    Past Surgical History:  Procedure Laterality Date   ATRIAL FLUTTER ABLATION  2010   Dr Allred   CARDIAC SURGERY     CORONARY ARTERY BYPASS GRAFT N/A 07/16/2017   Procedure: CORONARY ARTERY BYPASS GRAFTING (CABG) TIMES SIX  USING RIGHT AND LEFT  INTERNAL MAMMARY ARTERIES  AND RIGHT AND LEFT  SAPHENOUS VEIN HARVESTED ENDOSCOPICALLY;  Surgeon: Gaye Pollack, MD;  Location: [facility] OR;  Service: Open Heart Surgery;  Laterality: N/A;   LEFT HEART CATH AND CORONARY ANGIOGRAPHY N/A 07/15/2017   Procedure: LEFT HEART CATH AND CORONARY ANGIOGRAPHY;  Surgeon: Sherren Mocha, MD;  Location: [facility];  Service: Cardiovascular;  Laterality: N/A;   REPLACEMENT TOTAL KNEE BILATERAL     ROTATOR CUFF REPAIR     LEFT   TEE WITHOUT CARDIOVERSION N/A 07/16/2017   Procedure: TRANSESOPHAGEAL ECHOCARDIOGRAM (TEE);  Surgeon: Gaye Pollack, MD;  Location: [facility];  Service: Open Heart Surgery;  Laterality: N/A;       Family History  Problem Relation Age of Onset   Cancer Father    Diabetes Maternal Grandfather 90    Social History   Tobacco Use   Smoking status: Never   Smokeless tobacco: Never  Vaping Use   Vaping Use: Never used  Substance Use Topics   Alcohol use: Yes   Drug use: No    Home Medications Prior to Admission medications   Medication Sig Start Date End Date Taking? Authorizing Provider  acetaminophen (TYLENOL) 325 MG tablet Take 2 tablets (650 mg total) by mouth every 6 (six) hours as needed for mild pain. 07/23/17   Nani Skillern, PA-C  apixaban (ELIQUIS) 5 MG TABS tablet Take 1 tablet (5 mg total) by mouth 2 (two) times daily. 07/28/20   Charlynne Cousins, MD  atorvastatin (LIPITOR) 40 MG tablet Take 1 tablet (40 mg total) by mouth daily at 6 PM.  07/31/18   Sherren Mocha, MD  docusate sodium (COLACE) 100 MG capsule Take 100 mg by mouth 2 (two) times daily.    [provider]  glyBURIDE-metformin (GLUCOVANCE) 2.5-500 MG tablet Take 1 tablet by mouth daily with breakfast.    [provider]  levothyroxine (SYNTHROID, LEVOTHROID) 100 MCG tablet Take 1 tablet by mouth daily before breakfast. 10/01/17   [provider]  metoprolol tartrate (LOPRESSOR) 25 MG tablet Take 1 tablet (25 mg total) by mouth 2 (two) times daily. 07/31/18   Sherren Mocha, MD  Multiple Vitamin (MULTIVITAMIN) tablet Take 1 tablet by mouth daily.    [provider]  Multiple Vitamins-Minerals (PRESERVISION AREDS PO) Take 1 tablet by mouth daily.    [provider]  polyethylene glycol (MIRALAX / GLYCOLAX) packet Take 17 g by mouth daily.    [provider]  tamsulosin (FLOMAX) 0.4 MG CAPS capsule Take 0.4 mg by mouth daily.    [provider]  vitamin B-12 (CYANOCOBALAMIN) 1000 MCG tablet Take 1,000 mcg by mouth daily.    [provider]    Allergies    Robitussin 12 hour cough [dextromethorphan polistirex er]  Review of Systems   Review of Systems  Cardiovascular:  Positive for chest pain.  All other systems reviewed and are negative.  Physical Exam Updated Vital Signs BP (!) 147/89 (BP Location: Left Arm)   Pulse 83   Temp 99.1 F (37.3 C) (Oral)   Resp 16   Ht 5' 10.5" (1.791 m)   Wt 99 kg   SpO2 100%   BMI 30.87 kg/m   Physical Exam Vitals and nursing note reviewed.  Constitutional:      General: He is not in acute distress.    Appearance: Normal appearance. He is well-developed.  HENT:     Head: Normocephalic and atraumatic.  Eyes:     Conjunctiva/sclera: Conjunctivae normal.     Pupils: Pupils are equal, round, and reactive to light.  Cardiovascular:     Rate and Rhythm: Normal rate and regular rhythm.     Heart sounds: Normal heart sounds.  Pulmonary:     Effort:  Pulmonary effort is normal. No respiratory distress.     Breath sounds: Normal breath sounds.  Abdominal:     General: There is no distension.     Palpations: Abdomen is soft.     Tenderness: There is no abdominal tenderness.  Musculoskeletal:        General: No deformity. Normal range of motion.     Cervical back: Normal range of motion and neck supple.  Skin:    General: Skin is warm and dry.  Neurological:     General: No focal deficit present.     Mental Status: He is alert and oriented to person, place, and time.  ED Results / Procedures / Treatments   Labs (all labs ordered are listed, but only abnormal results are displayed) Labs Reviewed  CBC  BASIC METABOLIC PANEL  TROPONIN I (HIGH SENSITIVITY)  TROPONIN I (HIGH SENSITIVITY)    EKG EKG Interpretation  Date/Time:  Sunday June 14 2021 18:06:32 EST Ventricular Rate:  83 PR Interval:    QRS Duration: 146 QT Interval:  404 QTC Calculation: 474 R Axis:   87 Text Interpretation: Atrial fibrillation with premature ventricular or aberrantly conducted complexes Right bundle branch block Abnormal ECG Confirmed by Kristine Royal [phone]) on 06/14/2021 8:27:30 PM  Radiology DG Chest 2 View  Result Date: 06/14/2021 CLINICAL DATA:  Chest pain EXAM: CHEST - 2 VIEW COMPARISON:  07/28/2020 FINDINGS: The heart size and mediastinal contours are within normal limits. Both lungs are clear. The visualized skeletal structures are unremarkable. IMPRESSION: No active cardiopulmonary disease. Electronically Signed   By: Deatra Robinson M.D.   On: 06/14/2021 19:43    Procedures Procedures   Medications Ordered in ED Medications - No data to display  ED Course  I have reviewed the triage vital signs and the nursing notes.  Pertinent labs & imaging results that were available during my care of the patient were reviewed by me and considered in my medical decision making (see chart for details).    MDM Rules/Calculators/A&P                            MDM  MSE complete  [facility] was evaluated in Emergency Department on 06/14/2021 for the symptoms described in the history of present illness. He was evaluated in the context of the global COVID-19 pandemic, which necessitated consideration that the patient might be at risk for infection with the SARS-CoV-2 virus that causes COVID-19. Institutional protocols and algorithms that pertain to the evaluation of patients at risk for COVID-19 are in a state of rapid change based on information released by regulatory bodies including the CDC and federal and state organizations. These policies and algorithms were followed during the patient's care in the ED.  Patient is presenting for evaluation of transient episode of substernal chest discomfort.  Symptoms lasted approximate 30 minutes.  Onset of symptoms was around 5 PM today.  At time of evaluation patient is pain-free and comfortable.  Patient's current cardiology care is with Dr Early Chars in [place], [place].  Patient had previously been cared for by Dr. Tonny Bollman with [facility] (prior to moving to [facility]).  Patient is here in [place] for lengthy family visit with no plan to return to [place] prior to the end of the month.  Patient's EKG is without evidence of acute ischemia.  Troponin x2 are low (9 and 11).  Patient and patient's son at bedside are advised that initial labs and findings in the ED are reassuring.  They are advised that overnight admission and continued observation would be the safest course of action.  Patient and patient's son are concerned about the possibility of catching flu or some other infectious disease while admitted to the hospital.  They would prefer to go home.  They do understand need for close follow-up.  Strict return precautions given and understood.  The patient and the patient's son report that they will likely follow-up with Dr. Excell Seltzer here in [place] given that the  patient will be in town for at least several more weeks.  Final Clinical Impression(s) / ED Diagnoses Final diagnoses:  Chest pain, unspecified type    Rx / DC Orders ED Discharge Orders     None        Valarie Merino, MD 06/14/21 2225

## 2021-06-14 NOTE — ED Notes (Signed)
E-signature pad unavailable at time of pt discharge. This RN discussed discharge materials with pt and answered all pt questions. Pt stated understanding of discharge material. ? ?

## 2021-06-14 NOTE — Discharge Instructions (Signed)
Return for any problem.  ?

## 2021-06-14 NOTE — ED Provider Notes (Signed)
Emergency Medicine Provider Triage Evaluation Note  [facility] , a 87 y.o. male  was evaluated in triage.  Pt complains of chest pain / shortness of breath that began while climbing stairs 2 hours PTA. No numbness, tingling, radiation. No abdominal pain, nausea, vomiting. Patient is in constant Afib, takes chronic eliquis. Patient is somewhat confused about medical condition, possible dementia at baseline -- patient's family in route. Patient in no acute distress.  Review of Systems  Positive: As above Negative: As above  Physical Exam  BP (!) 147/89 (BP Location: Left Arm)   Pulse 83   Temp 99.1 F (37.3 C) (Oral)   Resp 16   Ht 5' 10.5" (1.791 m)   Wt 99 kg   SpO2 100%   BMI 30.87 kg/m  Gen:   Awake, no distress   Resp:  Normal effort  MSK:   Moves extremities without difficulty  Other:  Irregularly irregular heart rhythm  Medical Decision Making  Medically screening exam initiated at 6:22 PM.  Appropriate orders placed.  [facility] was informed that the remainder of the evaluation will be completed by another provider, this initial triage assessment does not replace that evaluation, and the importance of remaining in the ED until their evaluation is complete.  Chest pain, shortness of breath   Olene Floss, PA-C 06/14/21 1824    Rolan Bucco, MD 06/14/21 2035

## 2021-06-14 NOTE — ED Triage Notes (Signed)
The pt arrived from  [facility] with chest pain for 2 hours none now  ems gave him 324mg  of aspirin  hx of af

## 2021-06-15 ENCOUNTER — Telehealth: Payer: Self-pay | Admitting: Cardiovascular Disease

## 2021-06-15 NOTE — Telephone Encounter (Signed)
Patient's daughter called to get him visit because he was in the hospital over the weekend. And the discharge papers says to make appt with dr cooper. Patient doesn't live here he lives in [place]. There is nothing available to the Nov 29th @ 10. But patient 3 years runs out for the patient before then. Also patient might not be here this long. Please advise

## 2021-06-15 NOTE — Telephone Encounter (Signed)
Lm to call back ./cy 

## 2021-06-15 NOTE — Telephone Encounter (Signed)
Spoke with [facility] pt's daughter and appointment made for 06/23/21 at 3:20 pm with Dr Excell Seltzer .[facility]

## 2021-06-23 ENCOUNTER — Encounter: Payer: Self-pay | Admitting: Cardiovascular Disease

## 2021-06-23 ENCOUNTER — Ambulatory Visit: Payer: Medicare HMO | Admitting: Cardiovascular Disease

## 2021-06-23 ENCOUNTER — Other Ambulatory Visit: Payer: Self-pay

## 2021-06-23 VITALS — BP 142/72 | HR 65 | Ht 70.5 in | Wt 210.0 lb

## 2021-06-23 DIAGNOSIS — I4821 Permanent atrial fibrillation: Secondary | ICD-10-CM | POA: Diagnosis not present

## 2021-06-23 DIAGNOSIS — I10 Essential (primary) hypertension: Secondary | ICD-10-CM | POA: Diagnosis not present

## 2021-06-23 DIAGNOSIS — E782 Mixed hyperlipidemia: Secondary | ICD-10-CM | POA: Diagnosis not present

## 2021-06-23 DIAGNOSIS — I2510 Atherosclerotic heart disease of native coronary artery without angina pectoris: Secondary | ICD-10-CM | POA: Diagnosis not present

## 2021-06-23 NOTE — Patient Instructions (Signed)
Medication Instructions:  Your physician recommends that you continue on your current medications as directed. Please refer to the Current Medication list given to you today.  *If you need a refill on your cardiac medications before your next appointment, please call your pharmacy*   Lab Work: None ordered   If you have labs (blood work) drawn today and your tests are completely normal, you will receive your results only by: MyChart Message (if you have MyChart) OR A paper copy in the mail If you have any lab test that is abnormal or we need to change your treatment, we will call you to review the results.   Testing/Procedures: None ordered    Follow-Up: At [facility], you and your health needs are our priority.  As part of our continuing mission to provide you with exceptional heart care, we have created designated Provider Care Teams.  These Care Teams include your primary Cardiologist (physician) and Advanced Practice Providers (APPs -  Physician Assistants and Nurse Practitioners) who all work together to provide you with the care you need, when you need it.  We recommend signing up for the patient portal called "MyChart".  Sign up information is provided on this After Visit Summary.  MyChart is used to connect with patients for Virtual Visits (Telemedicine).  Patients are able to view lab/test results, encounter notes, upcoming appointments, etc.  Non-urgent messages can be sent to your provider as well.   To learn more about what you can do with MyChart, go to [email or web address].    Your next appointment:   6 month(s)  The format for your next appointment:   In Person  Provider:   Tereso Newcomer, PA-C   :1}   Follow up with Dr. Tonny Bollman in 1 year.You will receive a reminder letter in the mail two months in advance. If you don't receive a letter, please call our office to schedule the follow-up appointment.  Other Instructions None

## 2021-06-23 NOTE — Progress Notes (Signed)
Cardiology Office Note:    Date:  06/23/2021   ID:  Cody Rivers, DOB 02/28/1932, MRN [id number]  PCP:  No primary care provider on file.   [facility] Providers Cardiologist:  Tonny Bollman, MD     Referring MD: Wynetta Fines, MD   Chief Complaint  Patient presents with   Atrial Fibrillation     History of Present Illness:    Cody Rivers is a 87 y.o. male with a hx of coronary artery disease status post multivessel CABG in 2018 complicated by postoperative atrial fibrillation.  The patient was treated with a LIMA to LAD, RIMA to diagonal, saphenous vein graft to ramus intermedius, sequential saphenous vein graft to OM1 and OM 2, and saphenous vein graft to PDA.  Other comorbidities include hypertension, mixed hyperlipidemia, and type 2 diabetes.  The patient moved to [facility] after surgery and has been followed in a cardiology practice there.  He has gone on to develop permanent atrial fibrillation and he is managed with a strategy of rate control and chronic anticoagulation with apixaban.  He has not had anginal symptoms and follow-up.  He has tolerated anticoagulation without bleeding problems.  He presents today to reestablish care.  The patient is here alone today.  He was recently evaluated in the emergency room for atypical chest pain with an unremarkable EKG and normal troponins.  He presents for follow-up evaluation.  Unfortunately, the patient is a very poor historian.  He is pleasant and able to provide some details of his past, but he has no recollection of being in the emergency room last week.  He denies any recent problems of chest pain, chest pressure, shortness of breath, heart palpitations, or edema.  Past Medical History:  Diagnosis Date   Arthritis    Atrial flutter (HCC) 11/20/2008   Qualifier: Diagnosis of  By: Cody Frame, MD, James     Chest pain with moderate risk for cardiac etiology 07/14/2017   Coronary artery disease    Diabetes  mellitus without complication (HCC)    Dysrhythmia    Hyperlipidemia    Hypertension    Hypothyroidism     Past Surgical History:  Procedure Laterality Date   ATRIAL FLUTTER ABLATION  2010   Dr Allred   CARDIAC SURGERY     CORONARY ARTERY BYPASS GRAFT N/A 07/16/2017   Procedure: CORONARY ARTERY BYPASS GRAFTING (CABG) TIMES SIX  USING RIGHT AND LEFT  INTERNAL MAMMARY ARTERIES  AND RIGHT AND LEFT  SAPHENOUS VEIN HARVESTED ENDOSCOPICALLY;  Surgeon: Alleen Borne, MD;  Location: [facility] OR;  Service: Open Heart Surgery;  Laterality: N/A;   LEFT HEART CATH AND CORONARY ANGIOGRAPHY N/A 07/15/2017   Procedure: LEFT HEART CATH AND CORONARY ANGIOGRAPHY;  Surgeon: Tonny Bollman, MD;  Location[facility] INVASIVE CV LAB;  Service: Cardiovascular;  Laterality: N/A;   REPLACEMENT TOTAL KNEE BILATERAL     ROTATOR CUFF REPAIR     LEFT   TEE WITHOUT CARDIOVERSION N/A 07/16/2017   Procedure: TRANSESOPHAGEAL ECHOCARDIOGRAM (TEE);  Surgeon: Alleen Borne, MD;  Location: [facility] OR;  Service: Open Heart Surgery;  Laterality: N/A;    Current Medications: Current Meds  Medication Sig   acetaminophen (TYLENOL) 325 MG tablet Take 2 tablets (650 mg total) by mouth every 6 (six) hours as needed for mild pain.   apixaban (ELIQUIS) 5 MG TABS tablet Take 1 tablet (5 mg total) by mouth 2 (two) times daily.   atorvastatin (LIPITOR) 40 MG tablet Take 1 tablet (40 mg total)  by mouth daily at 6 PM.   docusate sodium (COLACE) 100 MG capsule Take 100 mg by mouth 2 (two) times daily.   glyBURIDE-metformin (GLUCOVANCE) 2.5-500 MG tablet Take 1 tablet by mouth daily with breakfast.   levothyroxine (SYNTHROID, LEVOTHROID) 100 MCG tablet Take 1 tablet by mouth daily before breakfast.   metoprolol tartrate (LOPRESSOR) 25 MG tablet Take 1 tablet (25 mg total) by mouth 2 (two) times daily.   Multiple Vitamin (MULTIVITAMIN) tablet Take 1 tablet by mouth daily.   Multiple Vitamins-Minerals (PRESERVISION AREDS PO) Take 1 tablet by mouth  daily.   polyethylene glycol (MIRALAX / GLYCOLAX) packet Take 17 g by mouth daily.   tamsulosin (FLOMAX) 0.4 MG CAPS capsule Take 0.4 mg by mouth daily.   vitamin B-12 (CYANOCOBALAMIN) 1000 MCG tablet Take 1,000 mcg by mouth daily.     Allergies:   Robitussin 12 hour cough [dextromethorphan polistirex er]   Social History   Socioeconomic History   Marital status: Widowed    Spouse name: Not on file   Number of children: Not on file   Years of education: Not on file   Highest education level: Not on file  Occupational History   Occupation: retired  Tobacco Use   Smoking status: Never   Smokeless tobacco: Never  Vaping Use   Vaping Use: Never used  Substance and Sexual Activity   Alcohol use: Yes   Drug use: No   Sexual activity: Never  Other Topics Concern   Not on file  Social History Narrative   Pt lives with his son.    Social Determinants of Health   Financial Resource Strain: Not on file  Food Insecurity: Not on file  Transportation Needs: Not on file  Physical Activity: Not on file  Stress: Not on file  Social Connections: Not on file     Family History: The patient's family history includes Cancer in his father; Diabetes (age of onset: 3990) in his maternal grandfather.  ROS:   Please see the history of present illness.    All other systems reviewed and are negative.  EKGs/Labs/Other Studies Reviewed:    The following studies were reviewed today: Echo 07/28/2020:  1. Left ventricular ejection fraction, by estimation, is 50 to 55%. The  left ventricle has low normal function. The left ventricle has no regional  wall motion abnormalities. Left ventricular diastolic function could not  be evaluated.   2. Right ventricular systolic function is normal. The right ventricular  size is normal.   3. Left atrial size was moderately dilated.   4. The mitral valve is abnormal. Trivial mitral valve regurgitation.   5. The aortic valve is tricuspid. Aortic valve  regurgitation is not  visualized. Mild aortic valve sclerosis is present, with no evidence of  aortic valve stenosis.   6. The inferior vena cava is normal in size with greater than 50%  respiratory variability, suggesting right atrial pressure of 3 mmHg.   Comparison(s): Changes from prior study are noted. 07/16/2017 (TEE): LVEF  55-60%.   Myoview Scan 07/28/2020: Rest only imaging performed. There is a large defect of moderate severity present in the basal inferoseptal, basal inferior, basal inferolateral, mid inferoseptal, mid inferior, apical inferior and apex location. This is an intermediate risk study. Findings consistent with prior myocardial infarction. Compared to prior stress in 2018, the defect in now extends into the basal-to-mid inferoseptal walls as well as the apical inferior wall and apex.  EKG:  EKG is not ordered today.  Recent Labs: 07/28/2020: Magnesium 1.7; TSH 0.503 06/14/2021: BUN 17; Creatinine, Ser 1.13; Hemoglobin 14.7; Platelets 205; Potassium 4.0; Sodium 141  Recent Lipid Panel    Component Value Date/Time   CHOL 150 07/28/2020 0545   TRIG 34 07/28/2020 0545   HDL 46 07/28/2020 0545   CHOLHDL 3.3 07/28/2020 0545   VLDL 7 07/28/2020 0545   LDLCALC 97 07/28/2020 0545     Risk Assessment/Calculations:    CHA2DS2-VASc Score = 5   This indicates a 7.2% annual risk of stroke. The patient's score is based upon: CHF History: 0 HTN History: 1 Diabetes History: 1 Stroke History: 0 Vascular Disease History: 1 Age Score: 2 Gender Score: 0          Physical Exam:    VS:  BP (!) 142/72   Pulse 65   Ht 5' 10.5" (1.791 m)   Wt 210 lb (95.3 kg)   SpO2 92%   BMI 29.71 kg/m     Wt Readings from Last 3 Encounters:  06/23/21 210 lb (95.3 kg)  06/14/21 218 lb 4.1 oz (99 kg)  07/28/20 218 lb 4.1 oz (99 kg)     GEN:  Well nourished, well developed in no acute distress HEENT: Normal NECK: No JVD; No carotid bruits LYMPHATICS: No  lymphadenopathy CARDIAC: Irregularly irregular, no murmurs, rubs, gallops RESPIRATORY:  Clear to auscultation without rales, wheezing or rhonchi  ABDOMEN: Soft, non-tender, non-distended MUSCULOSKELETAL:  No edema; No deformity  SKIN: Warm and dry NEUROLOGIC:  Alert and oriented x 3 PSYCHIATRIC:  Normal affect   ASSESSMENT:    1. Coronary artery disease involving native coronary artery of native heart without angina pectoris   2. Permanent atrial fibrillation ([place])   3. Essential hypertension   4. Mixed hyperlipidemia    PLAN:    In order of problems listed above:  The patient appears clinically stable.  He has no complaints today.  I did review his emergency room records.  The patient had no ischemic changes on his EKG and his troponins were normal.  He does not recall having any chest pain, but he clearly has a degree of dementia and may not remember his past symptoms.  I think ongoing medical therapy is appropriate. Rate controlled, anticoagulated with apixaban.  Does not appear to be at excessive fall risk.  Heart rate is controlled on metoprolol.  I reviewed cardiology records from his time in [place], [place].  I will arrange follow-up in 6 months Blood pressure is well controlled on metoprolol. Treated with atorvastatin.        Medication Adjustments/Labs and Tests Ordered: Current medicines are reviewed at length with the patient today.  Concerns regarding medicines are outlined above.  No orders of the defined types were placed in this encounter.  No orders of the defined types were placed in this encounter.   Patient Instructions  Medication Instructions:  Your physician recommends that you continue on your current medications as directed. Please refer to the Current Medication list given to you today.  *If you need a refill on your cardiac medications before your next appointment, please call your pharmacy*   Lab Work: None ordered   If you have labs  (blood work) drawn today and your tests are completely normal, you will receive your results only by: [place] (if you have MyChart) OR A paper copy in the mail If you have any lab test that is abnormal or we need to change your treatment, we will call you  to review the results.   Testing/Procedures: None ordered    Follow-Up: At [facility], you and your health needs are our priority.  As part of our continuing mission to provide you with exceptional heart care, we have created designated Provider Care Teams.  These Care Teams include your primary Cardiologist (physician) and Advanced Practice Providers (APPs -  Physician Assistants and Nurse Practitioners) who all work together to provide you with the care you need, when you need it.  We recommend signing up for the patient portal called "MyChart".  Sign up information is provided on this After Visit Summary.  MyChart is used to connect with patients for Virtual Visits (Telemedicine).  Patients are able to view lab/test results, encounter notes, upcoming appointments, etc.  Non-urgent messages can be sent to your provider as well.   To learn more about what you can do with MyChart, go to [email or web address].    Your next appointment:   6 month(s)  The format for your next appointment:   In Person  Provider:   Tereso Newcomer, PA-C   :1}   Follow up with Dr. Tonny Bollman in 1 year.You will receive a reminder letter in the mail two months in advance. If you don't receive a letter, please call our office to schedule the follow-up appointment.  Other Instructions None     Signed, Tonny Bollman, MD  06/23/2021 4:39 PM    [facility]

## 2024-01-08 DEATH — deceased
